# Patient Record
Sex: Female | Born: 1960
Health system: Southern US, Community
[De-identification: ages and names within clinical notes are randomized; demographics above are authoritative.]

## PROBLEM LIST (undated history)

## (undated) DIAGNOSIS — E785 Hyperlipidemia, unspecified: Secondary | ICD-10-CM

## (undated) HISTORY — DX: Hyperlipidemia, unspecified: E78.5

## (undated) HISTORY — PX: GUM SURGERY: SHX658

## (undated) HISTORY — PX: BREAST SURGERY: SHX581

---

## 1998-04-09 ENCOUNTER — Ambulatory Visit (HOSPITAL_COMMUNITY): Admission: RE | Admit: 1998-04-09 | Discharge: 1998-04-09 | Payer: Self-pay | Admitting: Obstetrics & Gynecology

## 2001-09-21 ENCOUNTER — Other Ambulatory Visit: Admission: RE | Admit: 2001-09-21 | Discharge: 2001-09-21 | Payer: Self-pay | Admitting: Obstetrics & Gynecology

## 2003-09-15 ENCOUNTER — Other Ambulatory Visit: Admission: RE | Admit: 2003-09-15 | Discharge: 2003-09-15 | Payer: Self-pay | Admitting: Obstetrics & Gynecology

## 2003-10-25 ENCOUNTER — Ambulatory Visit (HOSPITAL_COMMUNITY): Admission: RE | Admit: 2003-10-25 | Discharge: 2003-10-25 | Payer: Self-pay | Admitting: Obstetrics & Gynecology

## 2004-10-16 ENCOUNTER — Other Ambulatory Visit: Admission: RE | Admit: 2004-10-16 | Discharge: 2004-10-16 | Payer: Self-pay | Admitting: Obstetrics & Gynecology

## 2004-11-11 ENCOUNTER — Ambulatory Visit (HOSPITAL_COMMUNITY): Admission: RE | Admit: 2004-11-11 | Discharge: 2004-11-11 | Payer: Self-pay | Admitting: Obstetrics & Gynecology

## 2005-12-29 ENCOUNTER — Ambulatory Visit (HOSPITAL_COMMUNITY): Admission: RE | Admit: 2005-12-29 | Discharge: 2005-12-29 | Payer: Self-pay | Admitting: Obstetrics & Gynecology

## 2007-01-14 ENCOUNTER — Ambulatory Visit (HOSPITAL_COMMUNITY): Admission: RE | Admit: 2007-01-14 | Discharge: 2007-01-14 | Payer: Self-pay | Admitting: Obstetrics & Gynecology

## 2008-07-06 ENCOUNTER — Ambulatory Visit (HOSPITAL_COMMUNITY): Admission: RE | Admit: 2008-07-06 | Discharge: 2008-07-06 | Payer: Self-pay | Admitting: Obstetrics & Gynecology

## 2009-04-02 ENCOUNTER — Emergency Department (HOSPITAL_COMMUNITY): Admission: EM | Admit: 2009-04-02 | Discharge: 2009-04-02 | Payer: Self-pay | Admitting: Family Medicine

## 2009-06-15 ENCOUNTER — Ambulatory Visit: Payer: Self-pay | Admitting: Internal Medicine

## 2009-08-14 ENCOUNTER — Ambulatory Visit: Payer: Self-pay | Admitting: Internal Medicine

## 2009-08-16 ENCOUNTER — Ambulatory Visit: Payer: Self-pay | Admitting: Internal Medicine

## 2009-10-26 ENCOUNTER — Ambulatory Visit (HOSPITAL_COMMUNITY): Admission: RE | Admit: 2009-10-26 | Discharge: 2009-10-26 | Payer: Self-pay | Admitting: Obstetrics & Gynecology

## 2009-12-21 ENCOUNTER — Ambulatory Visit: Payer: Self-pay | Admitting: Internal Medicine

## 2010-08-14 ENCOUNTER — Ambulatory Visit: Payer: Self-pay | Admitting: Internal Medicine

## 2011-01-29 ENCOUNTER — Other Ambulatory Visit (HOSPITAL_COMMUNITY): Payer: Self-pay | Admitting: Obstetrics & Gynecology

## 2011-01-29 DIAGNOSIS — Z1231 Encounter for screening mammogram for malignant neoplasm of breast: Secondary | ICD-10-CM

## 2011-02-06 ENCOUNTER — Ambulatory Visit (HOSPITAL_COMMUNITY)
Admission: RE | Admit: 2011-02-06 | Discharge: 2011-02-06 | Disposition: A | Discharge status: Home / Self Care | Payer: 59 | Source: Ambulatory Visit | Attending: Obstetrics & Gynecology | Admitting: Obstetrics & Gynecology

## 2011-02-06 DIAGNOSIS — Z1231 Encounter for screening mammogram for malignant neoplasm of breast: Secondary | ICD-10-CM | POA: Insufficient documentation

## 2011-02-13 ENCOUNTER — Ambulatory Visit (INDEPENDENT_AMBULATORY_CARE_PROVIDER_SITE_OTHER): Payer: 59 | Admitting: Internal Medicine

## 2011-02-13 DIAGNOSIS — E039 Hypothyroidism, unspecified: Secondary | ICD-10-CM

## 2011-02-13 DIAGNOSIS — E785 Hyperlipidemia, unspecified: Secondary | ICD-10-CM

## 2011-08-18 ENCOUNTER — Encounter: Payer: Self-pay | Admitting: Internal Medicine

## 2011-08-19 ENCOUNTER — Encounter: Payer: Self-pay | Admitting: Internal Medicine

## 2011-08-19 ENCOUNTER — Ambulatory Visit (INDEPENDENT_AMBULATORY_CARE_PROVIDER_SITE_OTHER): Payer: 59 | Admitting: Internal Medicine

## 2011-08-19 VITALS — BP 116/74 | HR 68 | Temp 98.3°F | Ht 60.25 in | Wt 111.5 lb

## 2011-08-19 DIAGNOSIS — E039 Hypothyroidism, unspecified: Secondary | ICD-10-CM

## 2011-08-19 DIAGNOSIS — N6019 Diffuse cystic mastopathy of unspecified breast: Secondary | ICD-10-CM

## 2011-08-19 DIAGNOSIS — Z0000 Encounter for general adult medical examination without abnormal findings: Secondary | ICD-10-CM

## 2011-08-19 DIAGNOSIS — E785 Hyperlipidemia, unspecified: Secondary | ICD-10-CM

## 2011-08-19 DIAGNOSIS — R002 Palpitations: Secondary | ICD-10-CM

## 2011-08-19 DIAGNOSIS — H409 Unspecified glaucoma: Secondary | ICD-10-CM

## 2011-08-19 DIAGNOSIS — R011 Cardiac murmur, unspecified: Secondary | ICD-10-CM

## 2011-08-19 DIAGNOSIS — R3129 Other microscopic hematuria: Secondary | ICD-10-CM

## 2011-08-19 DIAGNOSIS — R311 Benign essential microscopic hematuria: Secondary | ICD-10-CM

## 2011-08-19 LAB — POCT URINALYSIS DIPSTICK
Bilirubin, UA: NEGATIVE
Glucose, UA: NEGATIVE
Ketones, UA: NEGATIVE
Leukocytes, UA: NEGATIVE
Nitrite, UA: NEGATIVE
Protein, UA: NEGATIVE
Spec Grav, UA: 1.01
Urobilinogen, UA: NEGATIVE
pH, UA: 5

## 2011-08-20 DIAGNOSIS — N6019 Diffuse cystic mastopathy of unspecified breast: Secondary | ICD-10-CM | POA: Insufficient documentation

## 2011-08-20 DIAGNOSIS — E785 Hyperlipidemia, unspecified: Secondary | ICD-10-CM | POA: Insufficient documentation

## 2011-08-20 DIAGNOSIS — H409 Unspecified glaucoma: Secondary | ICD-10-CM | POA: Insufficient documentation

## 2011-08-20 DIAGNOSIS — R311 Benign essential microscopic hematuria: Secondary | ICD-10-CM | POA: Insufficient documentation

## 2011-08-20 DIAGNOSIS — E039 Hypothyroidism, unspecified: Secondary | ICD-10-CM | POA: Insufficient documentation

## 2011-08-20 NOTE — Progress Notes (Signed)
  Subjective:    Patient ID: Jacqueline Graham, female    DOB: [birth year], 50 y.o.   MRN: [id number]  HPI 50 year old white female with history of hyperlipidemia, microscopic hematuria, hypothyroidism, migraine headaches, glaucoma both eyes for health maintenance exam. History of left fibroadenoma of breast in mid-1980s. Had [facility] spotted fever 1971. Last mammogram March 2012. Had tetanus immunization 2008. GYN physician is Dr. Aldona Bar  Recently had an episode of palpitations in bed. Wasn't aware of any irregular heartbeat just some pounding that was self-limited. Never had this before that she was aware of. Doesn't exercise although that much. 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 Is intolerant of sulfa and amoxicillin.  Recent lab work reviewed with her shows TSH to be normal at 1.650. Lipid panel is normal on statin therapy.    Review of Systems  Constitutional: Negative.   HENT: Negative.   Eyes: Negative.   Respiratory: Negative.   Cardiovascular: Positive for palpitations.  Genitourinary: Negative.   Musculoskeletal: Negative.   Neurological: Negative.   Hematological: Negative.   Psychiatric/Behavioral: Negative.        Objective:   Physical Exam  Vitals reviewed. Constitutional: She is oriented to person, place, and time. No distress.  HENT:  Head: Normocephalic and atraumatic.  Right Ear: External ear normal.  Left Ear: External ear normal.  Eyes: Conjunctivae and EOM are normal. Pupils are equal, round, and reactive to light. Right eye exhibits no discharge. Left eye exhibits no discharge. No scleral icterus.  Neck: Neck supple. No thyromegaly present.  Cardiovascular: Normal rate and regular rhythm.   Murmur heard.      2/6 short systolic ejection murmur  Pulmonary/Chest: Effort normal and breath sounds normal. She has no wheezes. She has no rales.  Abdominal: Soft. Bowel sounds are normal. She exhibits no mass. There is no tenderness. There is no  rebound and no guarding.  Genitourinary:       Deferred to GYN  Musculoskeletal: She exhibits no edema.  Neurological: She is alert and oriented to person, place, and time. She has normal reflexes. No cranial nerve deficit.  Skin: Skin is warm and dry.  Psychiatric: She has a normal mood and affect.          Assessment & Plan:  Systolic ejection murmur associated with recent episode of palpitations EKG shows borderline LVH. Blood pressure is normal. Patient will have 2-D echocardiogram for further evaluation.  Hyperlipidemia  Hypothyroidism  Glaucoma  Migraine headaches  Microscopic hematuria previously evaluated  History of fibrocystic breast disease  Have arranged for 2-D echocardiogram and will followup appropriately after that. Otherwise see her in 6 months for fasting lipid panel, liver functions TSH and office visit

## 2011-08-21 ENCOUNTER — Encounter: Payer: Self-pay | Admitting: Internal Medicine

## 2011-08-26 ENCOUNTER — Ambulatory Visit (HOSPITAL_COMMUNITY)
Admission: RE | Admit: 2011-08-26 | Discharge: 2011-08-26 | Disposition: A | Discharge status: Home / Self Care | Payer: 59 | Source: Ambulatory Visit | Attending: Internal Medicine | Admitting: Internal Medicine

## 2011-08-26 DIAGNOSIS — I059 Rheumatic mitral valve disease, unspecified: Secondary | ICD-10-CM | POA: Insufficient documentation

## 2011-08-26 DIAGNOSIS — R011 Cardiac murmur, unspecified: Secondary | ICD-10-CM | POA: Insufficient documentation

## 2011-08-26 DIAGNOSIS — I379 Nonrheumatic pulmonary valve disorder, unspecified: Secondary | ICD-10-CM | POA: Insufficient documentation

## 2011-08-26 DIAGNOSIS — E785 Hyperlipidemia, unspecified: Secondary | ICD-10-CM | POA: Insufficient documentation

## 2011-09-04 ENCOUNTER — Telehealth: Payer: Self-pay | Admitting: *Deleted

## 2011-09-04 NOTE — Telephone Encounter (Signed)
Pt called wanting you to call her regarding her Echo results.

## 2011-09-05 NOTE — Telephone Encounter (Signed)
Please ask Stacie to find - do not have in in-box

## 2011-09-05 NOTE — Telephone Encounter (Signed)
Left message for patient to call office.  Echo results WNL.

## 2011-10-07 ENCOUNTER — Other Ambulatory Visit: Payer: Self-pay | Admitting: Internal Medicine

## 2011-12-18 ENCOUNTER — Other Ambulatory Visit: Payer: Self-pay | Admitting: Internal Medicine

## 2012-02-17 ENCOUNTER — Ambulatory Visit (INDEPENDENT_AMBULATORY_CARE_PROVIDER_SITE_OTHER): Payer: 59 | Admitting: Internal Medicine

## 2012-02-17 ENCOUNTER — Encounter: Payer: Self-pay | Admitting: Internal Medicine

## 2012-02-17 VITALS — BP 104/68 | Temp 98.2°F | Wt 110.0 lb

## 2012-02-17 DIAGNOSIS — R1011 Right upper quadrant pain: Secondary | ICD-10-CM

## 2012-02-17 DIAGNOSIS — E0590 Thyrotoxicosis, unspecified without thyrotoxic crisis or storm: Secondary | ICD-10-CM

## 2012-02-17 DIAGNOSIS — E785 Hyperlipidemia, unspecified: Secondary | ICD-10-CM

## 2012-02-17 DIAGNOSIS — R1013 Epigastric pain: Secondary | ICD-10-CM

## 2012-02-17 NOTE — Patient Instructions (Signed)
We will schedule ultrasound of the gallbladder in the near future. Continue same medications and return in 6 months for physical exam.

## 2012-02-17 NOTE — Progress Notes (Signed)
  Subjective:    Patient ID: Jacqueline Graham, female    DOB: Aug 10, 1961, 51 y.o.   MRN: [id number]  HPI   51 year-old white female with history of hyperlipidemia and hypothyroidism in today for six-month recheck. Recent lab work done at [facility] is within normal limits. No changes in medication dosages. Patient's been having some pain in her right upper quadrant on occasion. She also had some epigastric pain when she was moving her parents to [place] from their home. Has not had persistent epigastric pain since that time. She took some TUMS. She wants to have her gallbladder checked. She took oral contraceptives for a number of years.    Review of Systems     Objective:   Physical Exam chest clear to auscultation; cardiac exam regular rate and rhythm; abdomen: nondistended, no hepatosplenomegaly masses or significant tenderness.           Assessment & Plan:  Hyperlipidemia-stable  Hypothyroidism-stable  Right upper quadrant abdominal pain rule out cholelithiasis  Recent epigastric pain doubt cardiac pain. It started at rest when she was driving out to help her parents move  Plan: Patient will have ultrasound of the gallbladder in the near future. Otherwise return in 6 months for physical exam.

## 2012-02-18 NOTE — Progress Notes (Signed)
Addended by: Judy Pimple on: 02/18/2012 01:15 PM   Modules accepted: Orders

## 2012-02-20 ENCOUNTER — Ambulatory Visit (HOSPITAL_COMMUNITY)
Admission: RE | Admit: 2012-02-20 | Discharge: 2012-02-20 | Disposition: A | Discharge status: Home / Self Care | Payer: 59 | Source: Ambulatory Visit | Attending: Internal Medicine | Admitting: Internal Medicine

## 2012-02-20 DIAGNOSIS — Q619 Cystic kidney disease, unspecified: Secondary | ICD-10-CM | POA: Insufficient documentation

## 2012-02-20 DIAGNOSIS — R1011 Right upper quadrant pain: Secondary | ICD-10-CM | POA: Insufficient documentation

## 2012-02-27 ENCOUNTER — Other Ambulatory Visit (HOSPITAL_COMMUNITY): Payer: Self-pay | Admitting: Obstetrics & Gynecology

## 2012-02-27 ENCOUNTER — Other Ambulatory Visit (HOSPITAL_COMMUNITY): Payer: 59

## 2012-02-27 DIAGNOSIS — Z1231 Encounter for screening mammogram for malignant neoplasm of breast: Secondary | ICD-10-CM

## 2012-03-24 ENCOUNTER — Ambulatory Visit (HOSPITAL_COMMUNITY)
Admission: RE | Admit: 2012-03-24 | Discharge: 2012-03-24 | Disposition: A | Discharge status: Home / Self Care | Payer: 59 | Source: Ambulatory Visit | Attending: Obstetrics & Gynecology | Admitting: Obstetrics & Gynecology

## 2012-03-24 DIAGNOSIS — Z1231 Encounter for screening mammogram for malignant neoplasm of breast: Secondary | ICD-10-CM | POA: Insufficient documentation

## 2012-04-20 ENCOUNTER — Other Ambulatory Visit: Payer: Self-pay | Admitting: Internal Medicine

## 2012-07-23 ENCOUNTER — Other Ambulatory Visit: Payer: Self-pay

## 2012-07-23 MED ORDER — LEVOTHYROXINE SODIUM 50 MCG PO TABS: 50.0000 ug | ORAL_TABLET | Freq: Every day | ORAL | Status: DC

## 2012-08-20 ENCOUNTER — Other Ambulatory Visit: Payer: 59 | Admitting: Internal Medicine

## 2012-08-20 LAB — CBC WITH DIFFERENTIAL/PLATELET
Basophils Absolute: 0 10*3/uL (ref 0.0–0.1)
Basophils Relative: 1 % (ref 0–1)
Eosinophils Absolute: 0.2 10*3/uL (ref 0.0–0.7)
Eosinophils Relative: 5 % (ref 0–5)
HCT: 36.3 % (ref 36.0–46.0)
Hemoglobin: 12.7 g/dL (ref 12.0–15.0)
Lymphocytes Relative: 48 % — ABNORMAL HIGH (ref 12–46)
Lymphs Abs: 1.9 10*3/uL (ref 0.7–4.0)
MCH: 31 pg (ref 26.0–34.0)
MCHC: 35 g/dL (ref 30.0–36.0)
MCV: 88.5 fL (ref 78.0–100.0)
Monocytes Absolute: 0.3 10*3/uL (ref 0.1–1.0)
Monocytes Relative: 9 % (ref 3–12)
Neutro Abs: 1.5 10*3/uL — ABNORMAL LOW (ref 1.7–7.7)
Neutrophils Relative %: 37 % — ABNORMAL LOW (ref 43–77)
Platelets: 203 10*3/uL (ref 150–400)
RBC: 4.1 MIL/uL (ref 3.87–5.11)
RDW: 13 % (ref 11.5–15.5)
WBC: 3.9 10*3/uL — ABNORMAL LOW (ref 4.0–10.5)

## 2012-08-20 LAB — TSH: TSH: 1.494 u[IU]/mL (ref 0.350–4.500)

## 2012-08-20 LAB — COMPREHENSIVE METABOLIC PANEL
ALT: 18 U/L (ref 0–35)
AST: 24 U/L (ref 0–37)
Albumin: 4.5 g/dL (ref 3.5–5.2)
Alkaline Phosphatase: 54 U/L (ref 39–117)
BUN: 16 mg/dL (ref 6–23)
CO2: 28 mEq/L (ref 19–32)
Calcium: 9.5 mg/dL (ref 8.4–10.5)
Chloride: 105 mEq/L (ref 96–112)
Creat: 0.96 mg/dL (ref 0.50–1.10)
Glucose, Bld: 75 mg/dL (ref 70–99)
Potassium: 4.1 mEq/L (ref 3.5–5.3)
Sodium: 141 mEq/L (ref 135–145)
Total Bilirubin: 0.5 mg/dL (ref 0.3–1.2)
Total Protein: 6.6 g/dL (ref 6.0–8.3)

## 2012-08-20 LAB — LIPID PANEL
Cholesterol: 150 mg/dL (ref 0–200)
HDL: 66 mg/dL (ref >39)
LDL Cholesterol: 75 mg/dL (ref 0–99)
Total CHOL/HDL Ratio: 2.3 Ratio
Triglycerides: 43 mg/dL (ref <150)
VLDL: 9 mg/dL (ref 0–40)

## 2012-08-21 LAB — VITAMIN D 25 HYDROXY (VIT D DEFICIENCY, FRACTURES): Vit D, 25-Hydroxy: 52 ng/mL (ref 30–89)

## 2012-08-23 ENCOUNTER — Encounter: Payer: 59 | Admitting: Internal Medicine

## 2012-11-02 ENCOUNTER — Encounter: Payer: Self-pay | Admitting: Internal Medicine

## 2012-11-02 ENCOUNTER — Ambulatory Visit (INDEPENDENT_AMBULATORY_CARE_PROVIDER_SITE_OTHER): Payer: 59 | Admitting: Internal Medicine

## 2012-11-02 VITALS — BP 102/58 | HR 76 | Ht 60.75 in | Wt 111.0 lb

## 2012-11-02 DIAGNOSIS — Z0000 Encounter for general adult medical examination without abnormal findings: Secondary | ICD-10-CM

## 2012-11-02 DIAGNOSIS — E039 Hypothyroidism, unspecified: Secondary | ICD-10-CM

## 2012-11-02 DIAGNOSIS — R002 Palpitations: Secondary | ICD-10-CM

## 2012-11-02 DIAGNOSIS — H409 Unspecified glaucoma: Secondary | ICD-10-CM

## 2012-11-02 DIAGNOSIS — Z8669 Personal history of other diseases of the nervous system and sense organs: Secondary | ICD-10-CM

## 2012-11-02 DIAGNOSIS — E785 Hyperlipidemia, unspecified: Secondary | ICD-10-CM

## 2012-11-02 LAB — POCT URINALYSIS DIPSTICK
Bilirubin, UA: NEGATIVE
Glucose, UA: NEGATIVE
Ketones, UA: NEGATIVE
Leukocytes, UA: NEGATIVE
Nitrite, UA: NEGATIVE
Protein, UA: NEGATIVE
Spec Grav, UA: 1.02
Urobilinogen, UA: NEGATIVE
pH, UA: 6

## 2012-11-02 NOTE — Patient Instructions (Addendum)
Continue same meds and return in 6 months 

## 2012-11-19 ENCOUNTER — Other Ambulatory Visit: Payer: Self-pay

## 2012-11-19 MED ORDER — LEVOTHYROXINE SODIUM 50 MCG PO TABS: 50.0000 ug | ORAL_TABLET | Freq: Every day | ORAL | Status: DC

## 2012-12-29 ENCOUNTER — Other Ambulatory Visit: Payer: Self-pay | Admitting: Internal Medicine

## 2013-02-21 ENCOUNTER — Encounter: Payer: Self-pay | Admitting: Internal Medicine

## 2013-02-21 DIAGNOSIS — R002 Palpitations: Secondary | ICD-10-CM | POA: Insufficient documentation

## 2013-02-21 NOTE — Progress Notes (Signed)
Subjective:    Patient ID: Jacqueline Graham, female    DOB: 1961-09-09, 52 y.o.   MRN: [id number]  HPI 52 year old white female with history of hyperlipidemia, microscopic hematuria, hypothyroidism, migraine headaches, glaucoma both eyes for health maintenance exam and evaluation of medical problems.  History of left breast fibroadenoma mid 1980s. [place] spotted fever 1971. Tetanus immunization 2008. GYN physician is Dr. Aldona Bar.  History of palpitations in 2012. A cardiac murmur was discovered at that time. She had 2-D echocardiogram October 2012 showing normal left ventricle and wall thickness. Systolic function was normal. Mildly thickened mitral valve leaflets. Pulmonic tricuspid valves normal. Pulmonary artery poorly visualized. Right atrium normal size. Aortic valve normal. He was thought to be essentially normal except for mildly thickened mitral valve leaflets.  Uses Xalatan for glaucoma, takes calcium supplement, is on thyroid replacement therapy and Zocor.  Nonsmoker. Social alcohol consumption.  Is allergic to amoxicillin and sulfa.  Works as an Dentist at [employer]. The should be admitted and we haven't on the same we don't have one if you don't see a definite but is less 100 and Social history: She is married has a two-year degree. No children. And in and in right peripheral to beta blockers pelvic but he  Family history: Mother with history of diabetes mellitus, hypertension, coronary artery disease, stent placement, hyperlipidemia and hypothyroidism. Father with history of third degree AV block, stroke and prostate issues.  Return grandmother with history of colon cancer. Maternal grandmother with history of lymphoma. 3 brothers and one sister. Brother with history of diabetes hypertension and kidney stones.    Review of Systems  Constitutional: Negative.   HENT: Negative.   Eyes:       History of glaucoma  Cardiovascular:        History of palpitations and  cardiac murmur  Gastrointestinal: Negative.   Endocrine: Negative.   Genitourinary: Negative.   Neurological: Negative.   Hematological: Negative.   Psychiatric/Behavioral: Negative.        Objective:   Physical Exam  Vitals reviewed. Constitutional: She is oriented to person, place, and time. She appears well-developed and well-nourished. No distress.  HENT:  Head: Normocephalic and atraumatic.  Right Ear: External ear normal.  Left Ear: External ear normal.  Mouth/Throat: Oropharynx is clear and moist. No oropharyngeal exudate.  Eyes: Conjunctivae and EOM are normal. Pupils are equal, round, and reactive to light. Right eye exhibits no discharge. Left eye exhibits no discharge. No scleral icterus.  Neck: Neck supple. No JVD present. No tracheal deviation present. No thyromegaly present.  Cardiovascular: Normal rate and regular rhythm.   Murmur heard. 2/6 systolic ejection murmur  Pulmonary/Chest: Effort normal and breath sounds normal. No respiratory distress. She has no wheezes. She has no rales. She exhibits no tenderness.  Breasts normal female  Genitourinary:  Deferred to GYN  Musculoskeletal: Normal range of motion.  Lymphadenopathy:    She has no cervical adenopathy.  Neurological: She is alert and oriented to person, place, and time. She has normal reflexes. She displays normal reflexes. No cranial nerve deficit.  Skin: Skin is warm and dry. No rash noted. She is not diaphoretic.  Psychiatric: She has a normal mood and affect. Her behavior is normal. Judgment and thought content normal.          Assessment & Plan:  Hyperlipidemia-stable on statin therapy  Hypothyroidism-stable on thyroid replacement therapy  History of palpitations and cardiac murmur previously evaluated by 2-D echocardiogram-stable   Glaucoma-stable on  Xalatan treated in both eyes    History of migraine headaches-stable and controlled  History of microhematuria previously evaluated by  urologist  History of fibrocystic breast disease  Plan: Patient is to return in 6 months for office visit blood pressure check lipid panel liver functions and TSH. No change in medication regimen.

## 2013-05-23 ENCOUNTER — Other Ambulatory Visit: Payer: 59 | Admitting: Internal Medicine

## 2013-05-23 DIAGNOSIS — Z79899 Other long term (current) drug therapy: Secondary | ICD-10-CM

## 2013-05-23 DIAGNOSIS — E039 Hypothyroidism, unspecified: Secondary | ICD-10-CM

## 2013-05-23 DIAGNOSIS — E785 Hyperlipidemia, unspecified: Secondary | ICD-10-CM

## 2013-05-23 LAB — TSH: TSH: 2.274 u[IU]/mL (ref 0.350–4.500)

## 2013-05-23 LAB — HEPATIC FUNCTION PANEL
ALT: 15 U/L (ref 0–35)
AST: 23 U/L (ref 0–37)
Albumin: 4.2 g/dL (ref 3.5–5.2)
Alkaline Phosphatase: 53 U/L (ref 39–117)
Bilirubin, Direct: 0.1 mg/dL (ref 0.0–0.3)
Indirect Bilirubin: 0.3 mg/dL (ref 0.0–0.9)
Total Bilirubin: 0.4 mg/dL (ref 0.3–1.2)
Total Protein: 6.7 g/dL (ref 6.0–8.3)

## 2013-05-23 LAB — LIPID PANEL
Cholesterol: 155 mg/dL (ref 0–200)
HDL: 69 mg/dL (ref >39)
LDL Cholesterol: 76 mg/dL (ref 0–99)
Total CHOL/HDL Ratio: 2.2 Ratio
Triglycerides: 49 mg/dL (ref <150)
VLDL: 10 mg/dL (ref 0–40)

## 2013-05-24 ENCOUNTER — Encounter: Payer: Self-pay | Admitting: Internal Medicine

## 2013-05-24 ENCOUNTER — Ambulatory Visit (INDEPENDENT_AMBULATORY_CARE_PROVIDER_SITE_OTHER): Payer: 59 | Admitting: Internal Medicine

## 2013-05-24 VITALS — BP 96/60 | HR 68 | Temp 98.3°F | Wt 111.0 lb

## 2013-05-24 DIAGNOSIS — E785 Hyperlipidemia, unspecified: Secondary | ICD-10-CM

## 2013-05-24 DIAGNOSIS — E039 Hypothyroidism, unspecified: Secondary | ICD-10-CM

## 2013-05-25 ENCOUNTER — Other Ambulatory Visit: Payer: Self-pay | Admitting: Internal Medicine

## 2013-06-03 ENCOUNTER — Other Ambulatory Visit (HOSPITAL_COMMUNITY): Payer: Self-pay | Admitting: Obstetrics & Gynecology

## 2013-06-03 DIAGNOSIS — Z1231 Encounter for screening mammogram for malignant neoplasm of breast: Secondary | ICD-10-CM

## 2013-06-08 ENCOUNTER — Ambulatory Visit (HOSPITAL_COMMUNITY): Payer: 59

## 2013-06-15 ENCOUNTER — Ambulatory Visit (HOSPITAL_COMMUNITY): Payer: 59

## 2013-06-23 ENCOUNTER — Ambulatory Visit (HOSPITAL_COMMUNITY)
Admission: RE | Admit: 2013-06-23 | Discharge: 2013-06-23 | Disposition: A | Discharge status: Home / Self Care | Payer: 59 | Source: Ambulatory Visit | Attending: Obstetrics & Gynecology | Admitting: Obstetrics & Gynecology

## 2013-06-23 DIAGNOSIS — Z1231 Encounter for screening mammogram for malignant neoplasm of breast: Secondary | ICD-10-CM | POA: Insufficient documentation

## 2013-08-26 ENCOUNTER — Encounter: Payer: Self-pay | Admitting: Internal Medicine

## 2013-08-26 NOTE — Patient Instructions (Addendum)
Continue same meds and return in 6 months 

## 2013-08-26 NOTE — Progress Notes (Signed)
  Subjective:    Patient ID: Jacqueline Graham, female    DOB: [birth year], 52 y.o.   MRN: [id number]  HPI In today for six-month recheck on hyperlipidemia and hypothyroidism. No new issues. Lipid panel and liver functions are entirely within normal limits. TSH is normal on thyroid replacement therapy. She feels well.    Review of Systems     Objective:   Physical Exam neck is supple without JVD thyromegaly or carotid bruits. Chest clear to auscultation. Cardiac exam regular rate and rhythm normal S1 and S2. Extremities without edema        Assessment & Plan:  Hyperlipidemia-stable on Zocor  Hypothyroidism-stable on thyroid replacement therapy  Plan: Continue same medications and return for physical exam in 6 months

## 2013-11-28 ENCOUNTER — Other Ambulatory Visit: Payer: 59 | Admitting: Internal Medicine

## 2013-11-28 DIAGNOSIS — Z130 Encounter for screening for diseases of the blood and blood-forming organs and certain disorders involving the immune mechanism: Secondary | ICD-10-CM

## 2013-11-28 DIAGNOSIS — Z0000 Encounter for general adult medical examination without abnormal findings: Secondary | ICD-10-CM

## 2013-11-28 DIAGNOSIS — Z1329 Encounter for screening for other suspected endocrine disorder: Secondary | ICD-10-CM

## 2013-11-28 DIAGNOSIS — E785 Hyperlipidemia, unspecified: Secondary | ICD-10-CM

## 2013-11-28 DIAGNOSIS — E039 Hypothyroidism, unspecified: Secondary | ICD-10-CM

## 2013-11-28 DIAGNOSIS — Z13228 Encounter for screening for other metabolic disorders: Secondary | ICD-10-CM

## 2013-11-28 LAB — COMPREHENSIVE METABOLIC PANEL
ALT: 19 U/L (ref 0–35)
AST: 23 U/L (ref 0–37)
Albumin: 4.2 g/dL (ref 3.5–5.2)
Alkaline Phosphatase: 54 U/L (ref 39–117)
BUN: 15 mg/dL (ref 6–23)
CO2: 29 mEq/L (ref 19–32)
Calcium: 9.5 mg/dL (ref 8.4–10.5)
Chloride: 102 mEq/L (ref 96–112)
Creat: 0.87 mg/dL (ref 0.50–1.10)
Glucose, Bld: 85 mg/dL (ref 70–99)
Potassium: 4 mEq/L (ref 3.5–5.3)
Sodium: 138 mEq/L (ref 135–145)
Total Bilirubin: 0.4 mg/dL (ref 0.3–1.2)
Total Protein: 6.7 g/dL (ref 6.0–8.3)

## 2013-11-28 LAB — CBC WITH DIFFERENTIAL/PLATELET
Basophils Absolute: 0 10*3/uL (ref 0.0–0.1)
Basophils Relative: 1 % (ref 0–1)
Eosinophils Absolute: 0.1 10*3/uL (ref 0.0–0.7)
Eosinophils Relative: 3 % (ref 0–5)
HCT: 37 % (ref 36.0–46.0)
Hemoglobin: 12.8 g/dL (ref 12.0–15.0)
Lymphocytes Relative: 39 % (ref 12–46)
Lymphs Abs: 1.8 10*3/uL (ref 0.7–4.0)
MCH: 30.8 pg (ref 26.0–34.0)
MCHC: 34.6 g/dL (ref 30.0–36.0)
MCV: 89.2 fL (ref 78.0–100.0)
Monocytes Absolute: 0.5 10*3/uL (ref 0.1–1.0)
Monocytes Relative: 10 % (ref 3–12)
Neutro Abs: 2.1 10*3/uL (ref 1.7–7.7)
Neutrophils Relative %: 47 % (ref 43–77)
Platelets: 221 10*3/uL (ref 150–400)
RBC: 4.15 MIL/uL (ref 3.87–5.11)
RDW: 14 % (ref 11.5–15.5)
WBC: 4.6 10*3/uL (ref 4.0–10.5)

## 2013-11-28 LAB — LIPID PANEL
CHOLESTEROL: 166 mg/dL (ref 0–200)
HDL: 69 mg/dL (ref >39)
LDL Cholesterol: 86 mg/dL (ref 0–99)
TRIGLYCERIDES: 54 mg/dL (ref <150)
Total CHOL/HDL Ratio: 2.4 Ratio
VLDL: 11 mg/dL (ref 0–40)

## 2013-11-28 LAB — TSH: TSH: 2.21 u[IU]/mL (ref 0.350–4.500)

## 2013-11-29 ENCOUNTER — Encounter: Payer: 59 | Admitting: Internal Medicine

## 2013-11-29 LAB — VITAMIN D 25 HYDROXY (VIT D DEFICIENCY, FRACTURES): Vit D, 25-Hydroxy: 45 ng/mL (ref 30–89)

## 2013-12-02 ENCOUNTER — Encounter: Payer: 59 | Admitting: Internal Medicine

## 2013-12-19 ENCOUNTER — Encounter: Payer: 59 | Admitting: Internal Medicine

## 2013-12-26 ENCOUNTER — Other Ambulatory Visit (HOSPITAL_COMMUNITY)
Admission: RE | Admit: 2013-12-26 | Discharge: 2013-12-26 | Disposition: A | Discharge status: Home / Self Care | Payer: 59 | Source: Ambulatory Visit | Attending: Internal Medicine | Admitting: Internal Medicine

## 2013-12-26 ENCOUNTER — Ambulatory Visit (INDEPENDENT_AMBULATORY_CARE_PROVIDER_SITE_OTHER): Payer: 59 | Admitting: Internal Medicine

## 2013-12-26 ENCOUNTER — Encounter: Payer: Self-pay | Admitting: Internal Medicine

## 2013-12-26 VITALS — BP 116/64 | HR 76 | Temp 98.4°F | Ht 61.0 in | Wt 111.0 lb

## 2013-12-26 DIAGNOSIS — E039 Hypothyroidism, unspecified: Secondary | ICD-10-CM

## 2013-12-26 DIAGNOSIS — I340 Nonrheumatic mitral (valve) insufficiency: Secondary | ICD-10-CM

## 2013-12-26 DIAGNOSIS — Z8679 Personal history of other diseases of the circulatory system: Secondary | ICD-10-CM

## 2013-12-26 DIAGNOSIS — E785 Hyperlipidemia, unspecified: Secondary | ICD-10-CM

## 2013-12-26 DIAGNOSIS — I050 Rheumatic mitral stenosis: Secondary | ICD-10-CM

## 2013-12-26 DIAGNOSIS — Z0000 Encounter for general adult medical examination without abnormal findings: Secondary | ICD-10-CM

## 2013-12-26 DIAGNOSIS — R3129 Other microscopic hematuria: Secondary | ICD-10-CM

## 2013-12-26 DIAGNOSIS — Z87898 Personal history of other specified conditions: Secondary | ICD-10-CM

## 2013-12-26 DIAGNOSIS — Z8669 Personal history of other diseases of the nervous system and sense organs: Secondary | ICD-10-CM

## 2013-12-26 DIAGNOSIS — Z01419 Encounter for gynecological examination (general) (routine) without abnormal findings: Secondary | ICD-10-CM | POA: Insufficient documentation

## 2013-12-26 DIAGNOSIS — H409 Unspecified glaucoma: Secondary | ICD-10-CM

## 2013-12-26 LAB — POCT URINALYSIS DIPSTICK
BILIRUBIN UA: NEGATIVE
Glucose, UA: NEGATIVE
KETONES UA: NEGATIVE
LEUKOCYTES UA: NEGATIVE
Nitrite, UA: NEGATIVE
PH UA: 5.5
Protein, UA: NEGATIVE
SPEC GRAV UA: 1.025
Urobilinogen, UA: NEGATIVE

## 2013-12-26 NOTE — Progress Notes (Signed)
   Subjective:    Patient ID: Jacqueline Graham, female    DOB: November 12, 1961, 53 y.o.   MRN: [id number]  HPI 53 year old Female for health maintenance and evaluation of medical issues. No new complaints. History of hyperlipidemia, microscopic hematuria, hypothyroidism, migraine headaches, glaucoma both eyes.  Past medical history: Left breast fibroadenoma removed in the mid 1980s. [place] spotted fever in 1971. Tetanus immunization 2008. Has GYN physician.  History of palpitations 2012. Cardiac murmur was discovered at the time. Had mildly thickened mitral valve leaflets on 2-D echocardiogram. Aortic valve was normal.  Patient uses Xalatan for glaucoma. Takes calcium supplement. Is on thyroid replacement for hypothyroidism and Zocor for hyperlipidemia.  Social history: Nonsmoker. Social alcohol consumption. 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 Has a 2 year college degree. No children.   Family History: Father had stroke and head injury about 5 years ago with hx of 3 degree AV block and syncope. Now has pacemaker. Mother with dementia and hx kidney stone s/p ARF after receiving IV contrast. Mother with hyperlipidemia, HTN, hypothyroidism, diabetes mellitus, coronary artery disease status post stent placement. Maternal grandmother with history of colon cancer. Maternal grandmother with history of lymphoma. 3 brothers and one sister. Brother with history of diabetes, hypertension and kidney stones. Review of Systems  Constitutional: Negative for activity change, appetite change, fatigue and unexpected weight change.  All other systems reviewed and are negative.      Objective:   Physical Exam  Vitals reviewed. Constitutional: She is oriented to person, place, and time. She appears well-developed and well-nourished. No distress.  HENT:  Head: Normocephalic and atraumatic.  Right Ear: External ear normal.  Left Ear: External ear normal.  Mouth/Throat: Oropharynx is clear  and moist. No oropharyngeal exudate.  Eyes: Conjunctivae and EOM are normal. Pupils are equal, round, and reactive to light. Right eye exhibits no discharge. Left eye exhibits no discharge. No scleral icterus.  Neck: Neck supple. No JVD present. No thyromegaly present.  Cardiovascular: Normal rate and regular rhythm.   Murmur heard. 2/6 systolic ejection murmur  Pulmonary/Chest: Effort normal and breath sounds normal. No respiratory distress. She has no wheezes. She has no rales. She exhibits no tenderness.  Breasts normal female  Abdominal: Soft. Bowel sounds are normal. She exhibits no distension and no mass. There is no tenderness. There is no rebound and no guarding.  Genitourinary:  Deferred to GYN  Musculoskeletal: She exhibits no edema.  Lymphadenopathy:    She has no cervical adenopathy.  Neurological: She is alert and oriented to person, place, and time. She has normal reflexes. She displays normal reflexes. No cranial nerve deficit.  Skin: Skin is warm and dry. No rash noted. She is not diaphoretic.  Psychiatric: She has a normal mood and affect. Her behavior is normal. Judgment and thought content normal.          Assessment & Plan:  Hypothyroidism-stable on thyroid replacement  Hyperlipidemia  History of glaucoma  History of migraine headaches  History of palpitations  Plan: Continue same medications and return in 6 months for followup on hypothyroidism and hyperlipidemia.

## 2013-12-26 NOTE — Patient Instructions (Signed)
Continue same meds and return in 6 months 

## 2014-01-16 ENCOUNTER — Other Ambulatory Visit: Payer: Self-pay | Admitting: Internal Medicine

## 2014-06-05 ENCOUNTER — Other Ambulatory Visit: Payer: Self-pay | Admitting: Internal Medicine

## 2014-06-27 ENCOUNTER — Other Ambulatory Visit: Payer: 59 | Admitting: Internal Medicine

## 2014-06-27 DIAGNOSIS — Z79899 Other long term (current) drug therapy: Secondary | ICD-10-CM

## 2014-06-27 DIAGNOSIS — E785 Hyperlipidemia, unspecified: Secondary | ICD-10-CM

## 2014-06-27 LAB — HEPATIC FUNCTION PANEL
ALT: 15 U/L (ref 0–35)
AST: 21 U/L (ref 0–37)
Albumin: 4.4 g/dL (ref 3.5–5.2)
Alkaline Phosphatase: 54 U/L (ref 39–117)
BILIRUBIN DIRECT: 0.1 mg/dL (ref 0.0–0.3)
BILIRUBIN TOTAL: 0.6 mg/dL (ref 0.2–1.2)
Indirect Bilirubin: 0.5 mg/dL (ref 0.2–1.2)
Total Protein: 6.5 g/dL (ref 6.0–8.3)

## 2014-06-27 LAB — LIPID PANEL
CHOL/HDL RATIO: 2.2 ratio
CHOLESTEROL: 153 mg/dL (ref 0–200)
HDL: 69 mg/dL (ref >39)
LDL Cholesterol: 75 mg/dL (ref 0–99)
Triglycerides: 46 mg/dL (ref <150)
VLDL: 9 mg/dL (ref 0–40)

## 2014-06-29 ENCOUNTER — Encounter: Payer: Self-pay | Admitting: Internal Medicine

## 2014-06-29 ENCOUNTER — Ambulatory Visit (INDEPENDENT_AMBULATORY_CARE_PROVIDER_SITE_OTHER): Payer: 59 | Admitting: Internal Medicine

## 2014-06-29 VITALS — BP 96/58 | HR 80 | Wt 111.0 lb

## 2014-06-29 DIAGNOSIS — E039 Hypothyroidism, unspecified: Secondary | ICD-10-CM

## 2014-06-29 DIAGNOSIS — E785 Hyperlipidemia, unspecified: Secondary | ICD-10-CM

## 2014-06-29 DIAGNOSIS — Z8669 Personal history of other diseases of the nervous system and sense organs: Secondary | ICD-10-CM

## 2014-06-29 NOTE — Patient Instructions (Signed)
Continue same medications and return in 6 months 

## 2014-06-29 NOTE — Progress Notes (Signed)
   Subjective:    Patient ID: Jacqueline Graham, female    DOB: 02-07-1961, 53 y.o.   MRN: [id number]  HPI  53 year old White Female in today for six-month recheck on hypothyroidism and hyperlipidemia. She is on simvastatin 20 mg daily and thyroid replacement therapy. She has a history of glaucoma and uses Xalatan eyedrops. Feels well. Blood pressure is excellent. No new complaints.    Review of Systems     Objective:   Physical Exam Neck is supple without thyromegaly. Chest clear. Cardiac exam regular rate and rhythm. Extremities without edema       Assessment & Plan:  Hypothyroidism-TSH pending  Hyperlipidemia-lipid panel liver functions normal on statin therapy  History of glaucoma  Plan: TSH is pending. Continue same medications and return for physical exam in 6 months. Will receive annual flu vaccine through employment.

## 2014-08-01 ENCOUNTER — Other Ambulatory Visit: Payer: Self-pay | Admitting: Internal Medicine

## 2014-08-01 DIAGNOSIS — Z1231 Encounter for screening mammogram for malignant neoplasm of breast: Secondary | ICD-10-CM

## 2014-08-08 ENCOUNTER — Telehealth: Payer: Self-pay

## 2014-08-08 DIAGNOSIS — Z1211 Encounter for screening for malignant neoplasm of colon: Secondary | ICD-10-CM

## 2014-08-08 NOTE — Telephone Encounter (Signed)
Patient called to let us know that she would like to see Dr Collene Mares for her colonoscopy.  Appointment 08/24/2014 at 230.   Patient aware.

## 2014-08-10 ENCOUNTER — Ambulatory Visit (HOSPITAL_COMMUNITY)
Admission: RE | Admit: 2014-08-10 | Discharge: 2014-08-10 | Disposition: A | Discharge status: Home / Self Care | Payer: 59 | Source: Ambulatory Visit | Attending: Internal Medicine | Admitting: Internal Medicine

## 2014-08-10 DIAGNOSIS — Z1231 Encounter for screening mammogram for malignant neoplasm of breast: Secondary | ICD-10-CM | POA: Insufficient documentation

## 2014-11-03 ENCOUNTER — Encounter: Payer: Self-pay | Admitting: Internal Medicine

## 2014-11-03 ENCOUNTER — Ambulatory Visit (INDEPENDENT_AMBULATORY_CARE_PROVIDER_SITE_OTHER): Payer: 59 | Admitting: Internal Medicine

## 2014-11-03 VITALS — BP 100/72 | HR 66 | Temp 97.9°F | Wt 110.0 lb

## 2014-11-03 DIAGNOSIS — J0100 Acute maxillary sinusitis, unspecified: Secondary | ICD-10-CM

## 2014-11-03 MED ORDER — LEVOFLOXACIN 500 MG PO TABS: 500.0000 mg | ORAL_TABLET | Freq: Every day | ORAL | Status: DC

## 2014-11-03 NOTE — Patient Instructions (Signed)
Take Levaquin 500 mg daily x 10 days. Take Sudafed PE before flying.

## 2014-11-03 NOTE — Progress Notes (Signed)
   Subjective:    Patient ID: Jacqueline Graham, female    DOB: May 22, 1961, 53 y.o.   MRN: [id number]  HPI  One-week history of URI symptoms. Sounds nasally congested. Has had some discolored nasal drainage. Not much cough. No fever or shaking chills. Is seldom sick. Leaving to go to [facility] on Sunday, December 13. Lost her voice chest her day at work.    Review of Systems     Objective:   Physical Exam  TMs are obscured by cerumen. Pharynx slightly injected without exudate. No cervical adenopathy. Chest clear to auscultation without rales or wheezing. Sounds nasally congested and hoarse.      Assessment & Plan:  Acute sinusitis  Laryngitis  Plan: Levaquin 500 milligrams daily for 10 days. Take Sudafed PE before flying.

## 2014-12-04 ENCOUNTER — Other Ambulatory Visit: Payer: Self-pay | Admitting: *Deleted

## 2014-12-04 MED ORDER — LEVOTHYROXINE SODIUM 50 MCG PO TABS
50.0000 ug | ORAL_TABLET | Freq: Every day | ORAL | Status: DC
Start: 2014-12-04 — End: 2015-06-13

## 2015-01-02 ENCOUNTER — Other Ambulatory Visit: Payer: 59 | Admitting: Internal Medicine

## 2015-01-02 DIAGNOSIS — Z1321 Encounter for screening for nutritional disorder: Secondary | ICD-10-CM

## 2015-01-02 DIAGNOSIS — Z0000 Encounter for general adult medical examination without abnormal findings: Secondary | ICD-10-CM

## 2015-01-02 DIAGNOSIS — Z130 Encounter for screening for diseases of the blood and blood-forming organs and certain disorders involving the immune mechanism: Secondary | ICD-10-CM

## 2015-01-02 DIAGNOSIS — Z1329 Encounter for screening for other suspected endocrine disorder: Secondary | ICD-10-CM

## 2015-01-02 DIAGNOSIS — Z13220 Encounter for screening for lipoid disorders: Secondary | ICD-10-CM

## 2015-01-02 LAB — COMPREHENSIVE METABOLIC PANEL
ALK PHOS: 53 U/L (ref 39–117)
ALT: 18 U/L (ref 0–35)
AST: 25 U/L (ref 0–37)
Albumin: 4 g/dL (ref 3.5–5.2)
BILIRUBIN TOTAL: 0.6 mg/dL (ref 0.2–1.2)
BUN: 13 mg/dL (ref 6–23)
CO2: 28 mEq/L (ref 19–32)
CREATININE: 0.84 mg/dL (ref 0.50–1.10)
Calcium: 9.5 mg/dL (ref 8.4–10.5)
Chloride: 106 mEq/L (ref 96–112)
Glucose, Bld: 74 mg/dL (ref 70–99)
Potassium: 4.9 mEq/L (ref 3.5–5.3)
Sodium: 141 mEq/L (ref 135–145)
Total Protein: 6.5 g/dL (ref 6.0–8.3)

## 2015-01-02 LAB — CBC WITH DIFFERENTIAL/PLATELET
Basophils Absolute: 0 10*3/uL (ref 0.0–0.1)
Basophils Relative: 1 % (ref 0–1)
Eosinophils Absolute: 0.2 10*3/uL (ref 0.0–0.7)
Eosinophils Relative: 5 % (ref 0–5)
HEMATOCRIT: 36 % (ref 36.0–46.0)
HEMOGLOBIN: 12.1 g/dL (ref 12.0–15.0)
LYMPHS ABS: 1.8 10*3/uL (ref 0.7–4.0)
LYMPHS PCT: 38 % (ref 12–46)
MCH: 30.6 pg (ref 26.0–34.0)
MCHC: 33.6 g/dL (ref 30.0–36.0)
MCV: 91.1 fL (ref 78.0–100.0)
MONO ABS: 0.4 10*3/uL (ref 0.1–1.0)
MPV: 8.8 fL (ref 8.6–12.4)
Monocytes Relative: 8 % (ref 3–12)
NEUTROS ABS: 2.3 10*3/uL (ref 1.7–7.7)
Neutrophils Relative %: 48 % (ref 43–77)
Platelets: 190 10*3/uL (ref 150–400)
RBC: 3.95 MIL/uL (ref 3.87–5.11)
RDW: 13.6 % (ref 11.5–15.5)
WBC: 4.8 10*3/uL (ref 4.0–10.5)

## 2015-01-02 LAB — LIPID PANEL
Cholesterol: 151 mg/dL (ref 0–200)
HDL: 67 mg/dL (ref >39)
LDL CALC: 74 mg/dL (ref 0–99)
Total CHOL/HDL Ratio: 2.3 Ratio
Triglycerides: 49 mg/dL (ref <150)
VLDL: 10 mg/dL (ref 0–40)

## 2015-01-02 LAB — TSH: TSH: 1.518 u[IU]/mL (ref 0.350–4.500)

## 2015-01-03 LAB — VITAMIN D 25 HYDROXY (VIT D DEFICIENCY, FRACTURES): VIT D 25 HYDROXY: 36 ng/mL (ref 30–100)

## 2015-01-04 ENCOUNTER — Encounter: Payer: Self-pay | Admitting: Internal Medicine

## 2015-01-04 ENCOUNTER — Ambulatory Visit (INDEPENDENT_AMBULATORY_CARE_PROVIDER_SITE_OTHER): Payer: 59 | Admitting: Internal Medicine

## 2015-01-04 VITALS — BP 100/64 | HR 65 | Temp 98.0°F | Wt 110.0 lb

## 2015-01-04 DIAGNOSIS — R311 Benign essential microscopic hematuria: Secondary | ICD-10-CM | POA: Diagnosis not present

## 2015-01-04 DIAGNOSIS — E785 Hyperlipidemia, unspecified: Secondary | ICD-10-CM | POA: Diagnosis not present

## 2015-01-04 DIAGNOSIS — R8290 Unspecified abnormal findings in urine: Secondary | ICD-10-CM | POA: Diagnosis not present

## 2015-01-04 DIAGNOSIS — Z8669 Personal history of other diseases of the nervous system and sense organs: Secondary | ICD-10-CM

## 2015-01-04 DIAGNOSIS — Z0000 Encounter for general adult medical examination without abnormal findings: Secondary | ICD-10-CM | POA: Diagnosis not present

## 2015-01-04 DIAGNOSIS — I340 Nonrheumatic mitral (valve) insufficiency: Secondary | ICD-10-CM | POA: Diagnosis not present

## 2015-01-04 DIAGNOSIS — E039 Hypothyroidism, unspecified: Secondary | ICD-10-CM

## 2015-01-04 DIAGNOSIS — H409 Unspecified glaucoma: Secondary | ICD-10-CM | POA: Diagnosis not present

## 2015-01-04 LAB — POCT URINALYSIS DIPSTICK
Bilirubin, UA: NEGATIVE
Glucose, UA: NEGATIVE
Ketones, UA: NEGATIVE
LEUKOCYTES UA: NEGATIVE
Nitrite, UA: NEGATIVE
Protein, UA: NEGATIVE
Spec Grav, UA: 1.015
UROBILINOGEN UA: NEGATIVE
pH, UA: 6.5

## 2015-01-05 LAB — URINALYSIS, MICROSCOPIC ONLY
BACTERIA UA: NONE SEEN
Casts: NONE SEEN
Crystals: NONE SEEN
Squamous Epithelial / LPF: NONE SEEN

## 2015-01-06 LAB — URINE CULTURE
COLONY COUNT: NO GROWTH
ORGANISM ID, BACTERIA: NO GROWTH

## 2015-02-09 ENCOUNTER — Other Ambulatory Visit: Payer: Self-pay | Admitting: Internal Medicine

## 2015-03-18 NOTE — Patient Instructions (Signed)
Continue same medications and return in 6 months 

## 2015-03-18 NOTE — Progress Notes (Signed)
   Subjective:    Patient ID: Jacqueline Graham, female    DOB: 01-Dec-1960, 54 y.o.   MRN: [id number]  HPI 54 year old female for health maintenance and evaluation of medical issues. History of hyperlipidemia, microscopic hematuria, hypothyroidism, migraine headaches, glaucoma in both eyes.  Past medical history: Left breast fibroadenoma removed in the mid 1980s. [place] spotted fever 1971. Tetanus immunization 2008.  Has GYN physician  History of palpitations in 2012. Cardiac murmur was discovered at that time. She had mildly thickened leaflets of the mitral valve on 2-D echocardiogram. Aortic valve was normal.  Patient uses Xalatan drops for glaucoma. Takes calcium supplement. Is on thyroid replacement for hypothyroidism and Zocor for hyperlipidemia.  Social history: Nonsmoker. Social alcohol consumption. 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 Has a two-year college degree.  Family history: Father had a stroke and had injury some 6 years ago with history of her degree AV block and syncope. He has a pacemaker. Mother with dementia and history of kidney stones status post acute renal failure after receiving IV contrast. Mother with history of hyperlipidemia, hypertension, hypothyroidism, diabetes mellitus, coronary artery disease status post stent placement. Paternal grandmother with history of colon cancer. Maternal grandmother with history of lymphoma. 3 brothers and one sister. Brother with history of diabetes, hypertension, kidney stones.  Colonoscopy was done December 2015 with repeat study in 10 years    Review of Systems  Constitutional: Negative.   All other systems reviewed and are negative.      Objective:   Physical Exam  Constitutional: She is oriented to person, place, and time. She appears well-developed and well-nourished. No distress.  HENT:  Head: Normocephalic and atraumatic.  Right Ear: External ear normal.  Left Ear: External  ear normal.  Mouth/Throat: Oropharynx is clear and moist. No oropharyngeal exudate.  Eyes: Conjunctivae and EOM are normal. Pupils are equal, round, and reactive to light. Right eye exhibits no discharge. Left eye exhibits no discharge. No scleral icterus.  Neck: Neck supple. No JVD present. No thyromegaly present.  Cardiovascular: Normal rate, regular rhythm, normal heart sounds and intact distal pulses.   No murmur heard. 2/6 systolic ejection murmur  Pulmonary/Chest: Effort normal. No respiratory distress. She has no wheezes. She has no rales.  Breasts normal female  Abdominal: She exhibits no distension and no mass. There is no tenderness. There is no rebound and no guarding.  Genitourinary:  Deferred to GYN  Musculoskeletal: Normal range of motion. She exhibits no edema.  Neurological: She is alert and oriented to person, place, and time. She has normal reflexes. No cranial nerve deficit.  Skin: Skin is warm and dry. No rash noted. She is not diaphoretic.  Psychiatric: She has a normal mood and affect. Her behavior is normal. Judgment and thought content normal.  Vitals reviewed.         Assessment & Plan:  Hypothyroidism-TSH stable on thyroid replacement  Hyperlipidemia-stable on statin medication  History of glaucoma  History of migraine headaches  History of palpitations  History of cardiac murmur with mildly thickened leaflets of mitral valve on 2-D echocardiogram  Plan: Continue same medications and return in 6 months.

## 2015-06-11 ENCOUNTER — Ambulatory Visit (INDEPENDENT_AMBULATORY_CARE_PROVIDER_SITE_OTHER): Payer: 59 | Admitting: Internal Medicine

## 2015-06-11 ENCOUNTER — Encounter: Payer: Self-pay | Admitting: Internal Medicine

## 2015-06-11 VITALS — BP 110/76 | HR 64 | Temp 98.3°F | Ht 61.0 in | Wt 111.0 lb

## 2015-06-11 DIAGNOSIS — R05 Cough: Secondary | ICD-10-CM

## 2015-06-11 DIAGNOSIS — R059 Cough, unspecified: Secondary | ICD-10-CM

## 2015-06-11 MED ORDER — METHYLPREDNISOLONE ACETATE 80 MG/ML IJ SUSP
80.0000 mg | Freq: Once | INTRAMUSCULAR | Status: AC
  Administered 2015-06-11: 80 mg via INTRAMUSCULAR

## 2015-06-11 NOTE — Patient Instructions (Signed)
Depomedrol 80 mg IM given. Uses Flonase nasal spray and take Claritin daily. Have CXR. Take pepcid twice a day.

## 2015-06-13 ENCOUNTER — Other Ambulatory Visit: Payer: Self-pay | Admitting: Internal Medicine

## 2015-06-18 ENCOUNTER — Telehealth: Payer: Self-pay | Admitting: *Deleted

## 2015-06-18 ENCOUNTER — Ambulatory Visit (HOSPITAL_COMMUNITY)
Admission: RE | Admit: 2015-06-18 | Discharge: 2015-06-18 | Disposition: A | Discharge status: Home / Self Care | Payer: 59 | Source: Ambulatory Visit | Attending: Internal Medicine | Admitting: Internal Medicine

## 2015-06-18 DIAGNOSIS — R05 Cough: Secondary | ICD-10-CM | POA: Insufficient documentation

## 2015-06-18 DIAGNOSIS — R059 Cough, unspecified: Secondary | ICD-10-CM

## 2015-06-18 NOTE — Telephone Encounter (Signed)
Reviewed chest xray results with patient.

## 2015-06-23 ENCOUNTER — Encounter: Payer: Self-pay | Admitting: Internal Medicine

## 2015-06-23 NOTE — Progress Notes (Signed)
   Subjective:    Patient ID: Jacqueline Graham, female    DOB: 28-Aug-1961, 54 y.o.   MRN: [id number]  HPI  Issues with persistent cough over the past few weeks. No significant sputum production. No fever or shaking chills. No shortness of breath or wheezing.    Review of Systems     Objective:   Physical Exam  Neck is supple without adenopathy. Chest clear to auscultation without rales or wheezing. Cardiac exam regular rate and rhythm normal S1 and S2. Extremities without edema.      Assessment & Plan:  Protracted cough-? Bronchitis  Possible allergic rhinitis  GE reflux  Plan: Chest x-ray. Take Pepcid twice daily. Depo-Medrol 80 mg IM. Use Flonase nasal spray daily. Take Claritin daily. Call if symptoms persist.

## 2015-08-07 ENCOUNTER — Other Ambulatory Visit: Payer: 59 | Admitting: Internal Medicine

## 2015-08-07 DIAGNOSIS — E785 Hyperlipidemia, unspecified: Secondary | ICD-10-CM

## 2015-08-07 DIAGNOSIS — Z79899 Other long term (current) drug therapy: Secondary | ICD-10-CM

## 2015-08-07 DIAGNOSIS — E039 Hypothyroidism, unspecified: Secondary | ICD-10-CM

## 2015-08-07 LAB — HEPATIC FUNCTION PANEL
ALT: 66 U/L — AB (ref 6–29)
AST: 49 U/L — ABNORMAL HIGH (ref 10–35)
Albumin: 4.3 g/dL (ref 3.6–5.1)
Alkaline Phosphatase: 63 U/L (ref 33–130)
BILIRUBIN DIRECT: 0.1 mg/dL (ref <=0.2)
BILIRUBIN INDIRECT: 0.5 mg/dL (ref 0.2–1.2)
Total Bilirubin: 0.6 mg/dL (ref 0.2–1.2)
Total Protein: 6.6 g/dL (ref 6.1–8.1)

## 2015-08-07 LAB — LIPID PANEL
Cholesterol: 161 mg/dL (ref 125–200)
HDL: 75 mg/dL (ref >=46)
LDL Cholesterol: 77 mg/dL (ref <130)
Total CHOL/HDL Ratio: 2.1 Ratio (ref <=5.0)
Triglycerides: 43 mg/dL (ref <150)
VLDL: 9 mg/dL (ref <30)

## 2015-08-07 LAB — TSH: TSH: 2.264 u[IU]/mL (ref 0.350–4.500)

## 2015-08-09 ENCOUNTER — Encounter: Payer: Self-pay | Admitting: Internal Medicine

## 2015-08-09 ENCOUNTER — Ambulatory Visit (INDEPENDENT_AMBULATORY_CARE_PROVIDER_SITE_OTHER): Payer: 59 | Admitting: Internal Medicine

## 2015-08-09 VITALS — BP 102/60 | HR 68 | Temp 98.9°F | Wt 111.0 lb

## 2015-08-09 DIAGNOSIS — E039 Hypothyroidism, unspecified: Secondary | ICD-10-CM | POA: Diagnosis not present

## 2015-08-09 DIAGNOSIS — R748 Abnormal levels of other serum enzymes: Secondary | ICD-10-CM

## 2015-08-09 DIAGNOSIS — E785 Hyperlipidemia, unspecified: Secondary | ICD-10-CM

## 2015-08-09 DIAGNOSIS — K219 Gastro-esophageal reflux disease without esophagitis: Secondary | ICD-10-CM | POA: Diagnosis not present

## 2015-08-09 MED ORDER — ESOMEPRAZOLE MAGNESIUM 40 MG PO CPDR: 40.0000 mg | DELAYED_RELEASE_CAPSULE | Freq: Every day | ORAL | Status: DC

## 2015-08-09 NOTE — Patient Instructions (Addendum)
Start Nexium daily. Stop pepcid. May need allergy testing.   RTC 6 weeks for LFTs. Continue other meds as prescribed.

## 2015-08-19 DIAGNOSIS — K219 Gastro-esophageal reflux disease without esophagitis: Secondary | ICD-10-CM | POA: Insufficient documentation

## 2015-08-19 NOTE — Progress Notes (Signed)
   Subjective:    Patient ID: Jacqueline Graham, female    DOB: 07/22/1961, 53 y.o.   MRN: [id number]  HPI in today to follow-up on hypothyroidism and hyperlipidemia. Has been to the beach from vacation. Says she probably drank more beer than she really should have been did not follow a strict diet. She has elevated SGOT of 49 and SGPT of 66 which may reflect her recent alcohol consumption. Has not been sick or had viral syndrome. No other medications to really explain the elevated liver functions Lipid panel and TSH are normal. Having issues with GE reflux. Has been taking Pepcid. Having issues with cough and hoarseness.   Review of Systems     Objective:   Physical Exam No hepatosplenomegaly masses or tenderness on abdominal exam       Assessment & Plan:  Hypothyroidism-TSH stable. Continue same dose of thyroid replacement  Elevated liver enzymes-these are only mildly elevated. Follow-up in 6 weeks with liver panel only. Watch alcohol consumption.  Hyperlipidemia-lipid panel normal. Continue statin medication  GE reflux-try Nexium instead of Pepcid. May need to have allergy testing for cough.  Plan: Repeat liver functions in 6 weeks without office visit. Booked physical exam  March 2017.

## 2015-10-02 ENCOUNTER — Other Ambulatory Visit: Payer: Self-pay | Admitting: Internal Medicine

## 2015-10-02 LAB — HEPATIC FUNCTION PANEL
ALK PHOS: 66 U/L (ref 33–130)
ALT: 19 U/L (ref 6–29)
AST: 25 U/L (ref 10–35)
Albumin: 4.2 g/dL (ref 3.6–5.1)
BILIRUBIN DIRECT: 0.1 mg/dL (ref <=0.2)
BILIRUBIN INDIRECT: 0.2 mg/dL (ref 0.2–1.2)
BILIRUBIN TOTAL: 0.3 mg/dL (ref 0.2–1.2)
Total Protein: 6.6 g/dL (ref 6.1–8.1)

## 2015-12-17 ENCOUNTER — Other Ambulatory Visit: Payer: Self-pay | Admitting: Internal Medicine

## 2015-12-17 MED FILL — LEVOTHYROXINE 50 MCG TABLET: 50 | 90 days supply | Qty: 90 | Fill #0

## 2015-12-17 MED FILL — LATANOPROST 0.005% EYE DRP: 0.005 | 90 days supply | Qty: 8 | Fill #3

## 2015-12-17 MED FILL — SIMVASTATIN 20 MG TABLET: 20 | 90 days supply | Qty: 90 | Fill #3

## 2016-01-29 ENCOUNTER — Other Ambulatory Visit: Payer: 59 | Admitting: Internal Medicine

## 2016-01-29 DIAGNOSIS — Z1321 Encounter for screening for nutritional disorder: Secondary | ICD-10-CM | POA: Diagnosis not present

## 2016-01-29 DIAGNOSIS — E785 Hyperlipidemia, unspecified: Secondary | ICD-10-CM | POA: Diagnosis not present

## 2016-01-29 DIAGNOSIS — E039 Hypothyroidism, unspecified: Secondary | ICD-10-CM | POA: Diagnosis not present

## 2016-01-29 DIAGNOSIS — Z130 Encounter for screening for diseases of the blood and blood-forming organs and certain disorders involving the immune mechanism: Secondary | ICD-10-CM | POA: Diagnosis not present

## 2016-01-29 DIAGNOSIS — Z79899 Other long term (current) drug therapy: Secondary | ICD-10-CM | POA: Diagnosis not present

## 2016-01-29 DIAGNOSIS — Z0000 Encounter for general adult medical examination without abnormal findings: Secondary | ICD-10-CM | POA: Diagnosis not present

## 2016-01-29 LAB — CBC WITH DIFFERENTIAL/PLATELET
BASOS ABS: 0.1 10*3/uL (ref 0.0–0.1)
Basophils Relative: 1 % (ref 0–1)
Eosinophils Absolute: 0.3 10*3/uL (ref 0.0–0.7)
Eosinophils Relative: 6 % — ABNORMAL HIGH (ref 0–5)
HEMATOCRIT: 38.2 % (ref 36.0–46.0)
HEMOGLOBIN: 12.9 g/dL (ref 12.0–15.0)
LYMPHS PCT: 39 % (ref 12–46)
Lymphs Abs: 2.2 10*3/uL (ref 0.7–4.0)
MCH: 30.3 pg (ref 26.0–34.0)
MCHC: 33.8 g/dL (ref 30.0–36.0)
MCV: 89.7 fL (ref 78.0–100.0)
MPV: 8.6 fL (ref 8.6–12.4)
Monocytes Absolute: 0.5 10*3/uL (ref 0.1–1.0)
Monocytes Relative: 9 % (ref 3–12)
NEUTROS ABS: 2.6 10*3/uL (ref 1.7–7.7)
NEUTROS PCT: 45 % (ref 43–77)
Platelets: 283 10*3/uL (ref 150–400)
RBC: 4.26 MIL/uL (ref 3.87–5.11)
RDW: 13.1 % (ref 11.5–15.5)
WBC: 5.7 10*3/uL (ref 4.0–10.5)

## 2016-01-29 LAB — COMPLETE METABOLIC PANEL WITH GFR
ALBUMIN: 4.1 g/dL (ref 3.6–5.1)
ALK PHOS: 65 U/L (ref 33–130)
ALT: 16 U/L (ref 6–29)
AST: 23 U/L (ref 10–35)
BUN: 14 mg/dL (ref 7–25)
CALCIUM: 9.9 mg/dL (ref 8.6–10.4)
CHLORIDE: 103 mmol/L (ref 98–110)
CO2: 28 mmol/L (ref 20–31)
Creat: 1 mg/dL (ref 0.50–1.05)
GFR, Est African American: 74 mL/min (ref >=60)
GFR, Est Non African American: 64 mL/min (ref >=60)
GLUCOSE: 76 mg/dL (ref 65–99)
Potassium: 5.3 mmol/L (ref 3.5–5.3)
Sodium: 140 mmol/L (ref 135–146)
Total Bilirubin: 0.4 mg/dL (ref 0.2–1.2)
Total Protein: 6.7 g/dL (ref 6.1–8.1)

## 2016-01-29 LAB — LIPID PANEL
CHOL/HDL RATIO: 2.5 ratio (ref <=5.0)
Cholesterol: 160 mg/dL (ref 125–200)
HDL: 64 mg/dL (ref >=46)
LDL CALC: 82 mg/dL (ref <130)
TRIGLYCERIDES: 71 mg/dL (ref <150)
VLDL: 14 mg/dL (ref <30)

## 2016-01-29 LAB — TSH: TSH: 2.52 mIU/L

## 2016-01-30 LAB — VITAMIN D 25 HYDROXY (VIT D DEFICIENCY, FRACTURES): Vit D, 25-Hydroxy: 33 ng/mL (ref 30–100)

## 2016-01-31 ENCOUNTER — Encounter: Payer: Self-pay | Admitting: Internal Medicine

## 2016-01-31 ENCOUNTER — Ambulatory Visit (INDEPENDENT_AMBULATORY_CARE_PROVIDER_SITE_OTHER): Payer: 59 | Admitting: Internal Medicine

## 2016-01-31 VITALS — BP 118/68 | HR 74 | Temp 97.5°F | Resp 18 | Ht 61.0 in | Wt 112.0 lb

## 2016-01-31 DIAGNOSIS — J309 Allergic rhinitis, unspecified: Secondary | ICD-10-CM

## 2016-01-31 DIAGNOSIS — E039 Hypothyroidism, unspecified: Secondary | ICD-10-CM | POA: Diagnosis not present

## 2016-01-31 DIAGNOSIS — Z8249 Family history of ischemic heart disease and other diseases of the circulatory system: Secondary | ICD-10-CM | POA: Diagnosis not present

## 2016-01-31 DIAGNOSIS — Z8669 Personal history of other diseases of the nervous system and sense organs: Secondary | ICD-10-CM

## 2016-01-31 DIAGNOSIS — H409 Unspecified glaucoma: Secondary | ICD-10-CM | POA: Diagnosis not present

## 2016-01-31 DIAGNOSIS — E785 Hyperlipidemia, unspecified: Secondary | ICD-10-CM | POA: Diagnosis not present

## 2016-01-31 DIAGNOSIS — R3129 Other microscopic hematuria: Secondary | ICD-10-CM | POA: Diagnosis not present

## 2016-01-31 DIAGNOSIS — Z0000 Encounter for general adult medical examination without abnormal findings: Secondary | ICD-10-CM

## 2016-01-31 MED ORDER — MONTELUKAST SODIUM 10 MG PO TABS: 10.0000 mg | ORAL_TABLET | Freq: Every day | ORAL | Status: DC

## 2016-01-31 MED FILL — MONTELUKAST SOD 10 MG TAB: 10 | 90 days supply | Qty: 90 | Fill #0

## 2016-01-31 NOTE — Progress Notes (Signed)
Subjective:    Patient ID: Jacqueline Graham, female    DOB: 06-03-1961, 55 y.o.   MRN: [id number]  HPI 55 year old White Female in today for health maintenance exam and evaluation of medical issues including hypothyroidism and hyperlipidemia. She feels well and has no new complaints. She has a history of GE reflux treated with Nexium. She has a history of allergic rhinitis treated with Zyrtec and Flonase but feels that allergies are continuing to bother her. She would like to try Singulair. Blood pressure is excellent. She has no history of hypertension. She has a history of migraine headaches and benign microscopic hematuria. Has glaucoma in both eyes.  Past medical history: Left breast fibroadenoma removed in the mid 1980s. [place] fever 1971.  Tetanus immunization 2008.  She has GYN physician.  History of palpitations and 2012. Cardiac murmur was discovered at that time. She had mildly thickened leaflets of the mitral valve on 2-D echocardiogram. Aortic valve was normal.  She uses Xalatan drops for glaucoma. Is on thyroid replacement for hypothyroidism and Zocor for hyperlipidemia.  Social history: Nonsmoker. Social alcohol consumption. 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 Has a 2 year college degree.  Colonoscopy was done December 2015 with repeat study in 10 years recommended.  Family history: Father had a stroke. He has a pacemaker. History of third-degree AV block and syncope. Mother with dementia and history of kidney stones status post acute renal failure after receiving IV contrast. Mother with history of hyperlipidemia, hypertension, hypothyroidism, diabetes mellitus, coronary artery disease status post stent placement. Paternal grandmother with history of colon cancer. Maternal grandmother with history of lymphoma. 3 brothers and 1 sister. Brother with history of diabetes, hypertension, kidney stones.     Review of Systems    Constitutional: Negative.   All other systems reviewed and are negative.      Objective:   Physical Exam  Constitutional: She is oriented to person, place, and time. She appears well-developed and well-nourished. No distress.  HENT:  Head: Normocephalic and atraumatic.  Right Ear: External ear normal.  Left Ear: External ear normal.  Mouth/Throat: Oropharynx is clear and moist. No oropharyngeal exudate.  Eyes: Conjunctivae and EOM are normal. Pupils are equal, round, and reactive to light. Right eye exhibits no discharge. Left eye exhibits no discharge. No scleral icterus.  Neck: Neck supple. No JVD present. No thyromegaly present.  Cardiovascular: Normal rate, regular rhythm, normal heart sounds and intact distal pulses.   No murmur heard. Pulmonary/Chest: Effort normal and breath sounds normal. No respiratory distress. She has no wheezes. She exhibits no tenderness.  Breasts normal female  Abdominal: Soft. Bowel sounds are normal. She exhibits no distension and no mass. There is no tenderness. There is no rebound and no guarding.  Genitourinary:  Deferred to GYN physician  Musculoskeletal: She exhibits no edema.  Lymphadenopathy:    She has no cervical adenopathy.  Neurological: She is alert and oriented to person, place, and time. She has normal reflexes. No cranial nerve deficit. Coordination normal.  Skin: Skin is warm and dry. No rash noted. She is not diaphoretic.  Psychiatric: She has a normal mood and affect. Her behavior is normal. Judgment and thought content normal.  Vitals reviewed.         Assessment & Plan:  Normal health maintenance exam  Hyperlipidemia-lipid panel normal on statin therapy  Glaucoma  Hypothyroidism stable on thyroid replacement therapy  History of migraine headaches  Family history of heart disease  Microscopic hematuria  History of benign cardiac murmur  Allergic rhinitis-trial of Singulair in addition to Zyrtec and steroid nasal  spray. Patient complains of some coughing which previously has been treated with PPI  GE reflux-continue PPI  Plan: 6 months ago had mild elevation of SGOT and SGPT. These are now normal. Lipid panel normal. TSH is normal. Return in 6 months for office visit lipid panel liver functions and TSH.

## 2016-02-21 ENCOUNTER — Encounter: Payer: Self-pay | Admitting: Internal Medicine

## 2016-02-21 DIAGNOSIS — Z8669 Personal history of other diseases of the nervous system and sense organs: Secondary | ICD-10-CM | POA: Insufficient documentation

## 2016-02-21 DIAGNOSIS — J309 Allergic rhinitis, unspecified: Secondary | ICD-10-CM | POA: Insufficient documentation

## 2016-02-21 DIAGNOSIS — Z8249 Family history of ischemic heart disease and other diseases of the circulatory system: Secondary | ICD-10-CM | POA: Insufficient documentation

## 2016-02-21 NOTE — Patient Instructions (Signed)
It was a pleasure to see you today. Continue same medications and return in 6 months. Try Singulair 10 mg daily for allergic rhinitis and continue same medications as previously prescribed.

## 2016-03-27 ENCOUNTER — Other Ambulatory Visit: Payer: Self-pay | Admitting: Internal Medicine

## 2016-03-27 MED FILL — SIMVASTATIN 20 MG TABLET: 20 | 90 days supply | Qty: 90 | Fill #0

## 2016-03-27 MED FILL — LATANOPROST 0.005% EYE DRP: 0.005 | 90 days supply | Qty: 8 | Fill #0

## 2016-03-27 MED FILL — LEVOTHYROXINE 50 MCG TABLET: 50 | 90 days supply | Qty: 90 | Fill #1

## 2016-04-10 DIAGNOSIS — H5203 Hypermetropia, bilateral: Secondary | ICD-10-CM | POA: Diagnosis not present

## 2016-04-10 DIAGNOSIS — H401131 Primary open-angle glaucoma, bilateral, mild stage: Secondary | ICD-10-CM | POA: Diagnosis not present

## 2016-04-10 DIAGNOSIS — H524 Presbyopia: Secondary | ICD-10-CM | POA: Diagnosis not present

## 2016-05-02 MED FILL — MONTELUKAST SOD 10 MG TAB: 10 | 90 days supply | Qty: 90 | Fill #1

## 2016-06-30 ENCOUNTER — Other Ambulatory Visit: Payer: Self-pay | Admitting: Internal Medicine

## 2016-06-30 DIAGNOSIS — Z1231 Encounter for screening mammogram for malignant neoplasm of breast: Secondary | ICD-10-CM

## 2016-07-01 ENCOUNTER — Ambulatory Visit
Admission: RE | Admit: 2016-07-01 | Discharge: 2016-07-01 | Disposition: A | Discharge status: Home / Self Care | Payer: 59 | Source: Ambulatory Visit | Attending: Internal Medicine | Admitting: Internal Medicine

## 2016-07-01 DIAGNOSIS — Z1231 Encounter for screening mammogram for malignant neoplasm of breast: Secondary | ICD-10-CM

## 2016-07-08 ENCOUNTER — Other Ambulatory Visit: Payer: Self-pay | Admitting: Internal Medicine

## 2016-07-08 MED FILL — SIMVASTATIN 20 MG TABLET: 20 | 90 days supply | Qty: 90 | Fill #1

## 2016-07-08 MED FILL — LEVOTHYROXINE 50 MCG TABLET: 50 | 90 days supply | Qty: 90 | Fill #0

## 2016-08-05 ENCOUNTER — Other Ambulatory Visit: Payer: 59 | Admitting: Internal Medicine

## 2016-08-05 ENCOUNTER — Other Ambulatory Visit: Payer: Self-pay | Admitting: Internal Medicine

## 2016-08-05 DIAGNOSIS — E785 Hyperlipidemia, unspecified: Secondary | ICD-10-CM

## 2016-08-05 DIAGNOSIS — N6019 Diffuse cystic mastopathy of unspecified breast: Secondary | ICD-10-CM | POA: Diagnosis not present

## 2016-08-05 DIAGNOSIS — Z8669 Personal history of other diseases of the nervous system and sense organs: Secondary | ICD-10-CM | POA: Diagnosis not present

## 2016-08-05 DIAGNOSIS — E039 Hypothyroidism, unspecified: Secondary | ICD-10-CM

## 2016-08-05 DIAGNOSIS — H409 Unspecified glaucoma: Secondary | ICD-10-CM

## 2016-08-05 MED FILL — MONTELUKAST SOD 10 MG TAB: 10 | 90 days supply | Qty: 90 | Fill #0

## 2016-08-06 LAB — LIPID PANEL
Cholesterol: 165 mg/dL (ref 125–200)
HDL: 76 mg/dL (ref >=46)
LDL CALC: 79 mg/dL (ref <130)
Total CHOL/HDL Ratio: 2.2 Ratio (ref <=5.0)
Triglycerides: 49 mg/dL (ref <150)
VLDL: 10 mg/dL (ref <30)

## 2016-08-06 LAB — HEPATIC FUNCTION PANEL
ALBUMIN: 4 g/dL (ref 3.6–5.1)
ALK PHOS: 56 U/L (ref 33–130)
ALT: 12 U/L (ref 6–29)
AST: 21 U/L (ref 10–35)
BILIRUBIN DIRECT: 0.1 mg/dL (ref <=0.2)
BILIRUBIN TOTAL: 0.4 mg/dL (ref 0.2–1.2)
Indirect Bilirubin: 0.3 mg/dL (ref 0.2–1.2)
Total Protein: 6.3 g/dL (ref 6.1–8.1)

## 2016-08-06 LAB — TSH: TSH: 2.29 mIU/L

## 2016-08-07 ENCOUNTER — Encounter: Payer: Self-pay | Admitting: Internal Medicine

## 2016-08-07 ENCOUNTER — Ambulatory Visit (INDEPENDENT_AMBULATORY_CARE_PROVIDER_SITE_OTHER): Payer: 59 | Admitting: Internal Medicine

## 2016-08-07 VITALS — BP 100/64 | HR 63 | Temp 98.0°F | Ht 61.0 in | Wt 110.0 lb

## 2016-08-07 DIAGNOSIS — J309 Allergic rhinitis, unspecified: Secondary | ICD-10-CM

## 2016-08-07 DIAGNOSIS — E785 Hyperlipidemia, unspecified: Secondary | ICD-10-CM

## 2016-08-07 DIAGNOSIS — E039 Hypothyroidism, unspecified: Secondary | ICD-10-CM | POA: Diagnosis not present

## 2016-08-07 DIAGNOSIS — H409 Unspecified glaucoma: Secondary | ICD-10-CM

## 2016-08-18 MED FILL — LATANOPROST 0.005% EYE DRP: 0.005 | 90 days supply | Qty: 8 | Fill #1

## 2016-08-20 NOTE — Patient Instructions (Signed)
He was pleasure to see you today. Have flu vaccine at work. Continue same medications and return in 6 months for physical examination.

## 2016-08-20 NOTE — Progress Notes (Signed)
   Subjective:    Patient ID: Renato Gails, female    DOB: [birth year], 55 y.o.   MRN: [id number]  HPI 55 year old Female in today for six-month recheck. History of hyperlipidemia maintained on Zocor and hypothyroidism treated with Synthroid. She feels well and has no complaints. Blood pressure is excellent 100/64. History of GE reflux treated with Nexium and allergic rhinitis treated with Zyrtec and Flonase. We also added Singulair 4 allergy symptoms and that helped a great deal. She has a history of migraine headaches and glaucoma in both eyes.  History of cardiac murmur with mildly thickened leaflets of the mitral valve on 2-D echocardiogram. This was found because of complaint of palpitations and 2012.  History of left breast fibroadenoma removed in the 1980s.    Review of Systems     Objective:   Physical Exam Skin warm and dry. Nodes none. Neck is supple without JVD thyromegaly or carotid bruits. Chest clear. Cardiac exam regular rate and rhythm normal S1 and S2. Extremity is without edema       Assessment & Plan:  Hyperlipidemia-stable on statin medication  Hypothyroidism-TSH normal on thyroid replacement  Allergic rhinitis-symptoms stable per [place] after adding Singulair  Plan: Return in 6 months for physical examination or as needed. Will get flu vaccine through employment.

## 2016-10-13 ENCOUNTER — Other Ambulatory Visit: Payer: Self-pay | Admitting: Internal Medicine

## 2016-10-13 MED FILL — SIMVASTATIN 20 MG TABLET: 20 | 90 days supply | Qty: 90 | Fill #0

## 2016-10-13 MED FILL — LEVOTHYROXINE 50 MCG TABLET: 50 | 90 days supply | Qty: 90 | Fill #1

## 2016-10-23 DIAGNOSIS — H401131 Primary open-angle glaucoma, bilateral, mild stage: Secondary | ICD-10-CM | POA: Diagnosis not present

## 2016-10-31 MED FILL — MONTELUKAST SOD 10 MG TAB: 10 | 90 days supply | Qty: 90 | Fill #1

## 2016-11-20 MED FILL — LATANOPROST 0.005% EYE DRP: 0.005 | 90 days supply | Qty: 8 | Fill #2

## 2017-01-16 ENCOUNTER — Other Ambulatory Visit: Payer: Self-pay | Admitting: Internal Medicine

## 2017-01-16 MED FILL — SIMVASTATIN 20 MG TABLET: 20 | 90 days supply | Qty: 90 | Fill #1

## 2017-01-16 MED FILL — LEVOTHYROXINE 50 MCG TABLET: 50 | 90 days supply | Qty: 90 | Fill #0

## 2017-02-10 ENCOUNTER — Other Ambulatory Visit: Payer: 59 | Admitting: Internal Medicine

## 2017-02-10 DIAGNOSIS — Z13220 Encounter for screening for lipoid disorders: Secondary | ICD-10-CM | POA: Diagnosis not present

## 2017-02-10 DIAGNOSIS — Z1321 Encounter for screening for nutritional disorder: Secondary | ICD-10-CM | POA: Diagnosis not present

## 2017-02-10 DIAGNOSIS — Z1329 Encounter for screening for other suspected endocrine disorder: Secondary | ICD-10-CM

## 2017-02-10 DIAGNOSIS — Z0000 Encounter for general adult medical examination without abnormal findings: Secondary | ICD-10-CM | POA: Diagnosis not present

## 2017-02-10 LAB — LIPID PANEL
CHOL/HDL RATIO: 2.3 ratio (ref <5.0)
Cholesterol: 170 mg/dL (ref <200)
HDL: 73 mg/dL (ref >50)
LDL CALC: 81 mg/dL (ref <100)
TRIGLYCERIDES: 79 mg/dL (ref <150)
VLDL: 16 mg/dL (ref <30)

## 2017-02-10 LAB — CBC WITH DIFFERENTIAL/PLATELET
BASOS ABS: 0 {cells}/uL (ref 0–200)
Basophils Relative: 0 %
EOS ABS: 270 {cells}/uL (ref 15–500)
Eosinophils Relative: 5 %
HEMATOCRIT: 39.5 % (ref 35.0–45.0)
Hemoglobin: 12.9 g/dL (ref 11.7–15.5)
LYMPHS PCT: 32 %
Lymphs Abs: 1728 cells/uL (ref 850–3900)
MCH: 29.9 pg (ref 27.0–33.0)
MCHC: 32.7 g/dL (ref 32.0–36.0)
MCV: 91.4 fL (ref 80.0–100.0)
MONO ABS: 486 {cells}/uL (ref 200–950)
MPV: 9 fL (ref 7.5–12.5)
Monocytes Relative: 9 %
NEUTROS PCT: 54 %
Neutro Abs: 2916 cells/uL (ref 1500–7800)
Platelets: 221 10*3/uL (ref 140–400)
RBC: 4.32 MIL/uL (ref 3.80–5.10)
RDW: 13.7 % (ref 11.0–15.0)
WBC: 5.4 10*3/uL (ref 3.8–10.8)

## 2017-02-10 LAB — TSH: TSH: 1.87 m[IU]/L

## 2017-02-10 LAB — COMPREHENSIVE METABOLIC PANEL
ALK PHOS: 58 U/L (ref 33–130)
ALT: 14 U/L (ref 6–29)
AST: 21 U/L (ref 10–35)
Albumin: 4.5 g/dL (ref 3.6–5.1)
BUN: 17 mg/dL (ref 7–25)
CO2: 27 mmol/L (ref 20–31)
Calcium: 9.9 mg/dL (ref 8.6–10.4)
Chloride: 105 mmol/L (ref 98–110)
Creat: 1.1 mg/dL — ABNORMAL HIGH (ref 0.50–1.05)
GLUCOSE: 77 mg/dL (ref 65–99)
POTASSIUM: 5.1 mmol/L (ref 3.5–5.3)
Sodium: 140 mmol/L (ref 135–146)
Total Bilirubin: 0.5 mg/dL (ref 0.2–1.2)
Total Protein: 6.8 g/dL (ref 6.1–8.1)

## 2017-02-11 LAB — VITAMIN D 25 HYDROXY (VIT D DEFICIENCY, FRACTURES): Vit D, 25-Hydroxy: 38 ng/mL (ref 30–100)

## 2017-02-13 ENCOUNTER — Encounter: Payer: Self-pay | Admitting: Internal Medicine

## 2017-02-13 ENCOUNTER — Ambulatory Visit (INDEPENDENT_AMBULATORY_CARE_PROVIDER_SITE_OTHER): Payer: 59 | Admitting: Internal Medicine

## 2017-02-13 ENCOUNTER — Other Ambulatory Visit (HOSPITAL_COMMUNITY)
Admission: RE | Admit: 2017-02-13 | Discharge: 2017-02-13 | Disposition: A | Discharge status: Home / Self Care | Payer: 59 | Source: Ambulatory Visit | Attending: Internal Medicine | Admitting: Internal Medicine

## 2017-02-13 VITALS — BP 90/60 | HR 56 | Temp 97.0°F | Ht 59.5 in | Wt 110.0 lb

## 2017-02-13 DIAGNOSIS — E784 Other hyperlipidemia: Secondary | ICD-10-CM

## 2017-02-13 DIAGNOSIS — E039 Hypothyroidism, unspecified: Secondary | ICD-10-CM

## 2017-02-13 DIAGNOSIS — Z01419 Encounter for gynecological examination (general) (routine) without abnormal findings: Secondary | ICD-10-CM | POA: Diagnosis not present

## 2017-02-13 DIAGNOSIS — J302 Other seasonal allergic rhinitis: Secondary | ICD-10-CM | POA: Diagnosis not present

## 2017-02-13 DIAGNOSIS — Z0000 Encounter for general adult medical examination without abnormal findings: Secondary | ICD-10-CM | POA: Diagnosis not present

## 2017-02-13 DIAGNOSIS — Z8249 Family history of ischemic heart disease and other diseases of the circulatory system: Secondary | ICD-10-CM | POA: Diagnosis not present

## 2017-02-13 DIAGNOSIS — K219 Gastro-esophageal reflux disease without esophagitis: Secondary | ICD-10-CM

## 2017-02-13 DIAGNOSIS — Z124 Encounter for screening for malignant neoplasm of cervix: Secondary | ICD-10-CM | POA: Diagnosis not present

## 2017-02-13 DIAGNOSIS — R3129 Other microscopic hematuria: Secondary | ICD-10-CM | POA: Diagnosis not present

## 2017-02-13 DIAGNOSIS — E7849 Other hyperlipidemia: Secondary | ICD-10-CM

## 2017-02-13 LAB — POCT URINALYSIS DIPSTICK
BILIRUBIN UA: NEGATIVE
GLUCOSE UA: NEGATIVE
KETONES UA: NEGATIVE
Leukocytes, UA: NEGATIVE
Nitrite, UA: NEGATIVE
PH UA: 6 (ref 5.0–8.0)
Protein, UA: NEGATIVE
Spec Grav, UA: 1.01 (ref 1.030–1.035)
Urobilinogen, UA: 0.2 (ref ?–2.0)

## 2017-02-13 MED ORDER — MONTELUKAST SODIUM 10 MG PO TABS: 10.0000 mg | ORAL_TABLET | Freq: Every day | ORAL | 1 refills | Status: DC

## 2017-02-13 MED FILL — MONTELUKAST SOD 10 MG TAB: 10 | 90 days supply | Qty: 90 | Fill #0

## 2017-02-17 LAB — CYTOLOGY - PAP: DIAGNOSIS: NEGATIVE

## 2017-02-20 NOTE — Progress Notes (Signed)
Subjective:    Patient ID: Jacqueline Graham, female    DOB: 1961/10/05, 56 y.o.   MRN: [id number]  HPI Pleasant 56 year old Female in today for health maintenance exam. Patient had colonoscopy in the past by Dr. Collene Mares. We do not have record of colonoscopy in Epic. We will call and request records with date of last colonoscopy.We think study was done December 2015.  She has a history of hypothyroidism and hyperlipidemia. Feels well. GE reflux treated with Nexium. History of allergic rhinitis treated with Zyrtec and Flonase. Had a cough at one point but that improved after trying Singulair. History of migraine headaches. Has glaucoma both eyes.  Past medical history: Left breast fibroadenoma removed in the mid 1980s. [place] fever 1971.  Prefers it GYN checkup be done here.  History of palpitations in 2012. Cardiac murmur was discovered at the time. She had mildly thickened leaflets of the mitral valve on 2-D echo. Aortic valve was normal.  She uses Xalatan eyedrops for glaucoma.  Social history: Nonsmoker. Social alcohol consumption. 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 Has 2 year college degree.  Family history: Father had a stroke. He has pacemaker. History of third-degree AV block and syncope. Mother with history of dementia, kidney stones, status post acute renal failure after receiving IV contrast dye. Mother also has hyperlipidemia, hypertension, hypothyroidism, diabetes, coronary artery disease status post stent placement. Paternal grandmother with history of colon cancer. Maternal grandmother with history of lymphoma. 3 brothers and 1 sister. One Brother has history of diabetes, hypertension, kidney stones.    Review of Systems  Constitutional: Negative.   All other systems reviewed and are negative.      Objective:   Physical Exam  Constitutional: She is oriented to person, place, and time. She appears well-developed and  well-nourished. No distress.  HENT:  Head: Normocephalic and atraumatic.  Right Ear: External ear normal.  Left Ear: External ear normal.  Mouth/Throat: Oropharynx is clear and moist.  Eyes: Conjunctivae and EOM are normal. Pupils are equal, round, and reactive to light. Right eye exhibits no discharge. Left eye exhibits no discharge. No scleral icterus.  Neck: Neck supple. No JVD present. No thyromegaly present.  Cardiovascular: Normal rate, regular rhythm and intact distal pulses.   Murmur heard. 1/6 systolic ejection  Pulmonary/Chest: Effort normal and breath sounds normal. No respiratory distress. She has no wheezes. She has no rales. She exhibits no tenderness.  Breasts normal female  Abdominal: Soft. Bowel sounds are normal. She exhibits no distension and no mass. There is no tenderness. There is no rebound and no guarding.  Genitourinary:  Genitourinary Comments: Pap taken. Bimanual normal.  Musculoskeletal: She exhibits no edema.  Lymphadenopathy:    She has no cervical adenopathy.  Neurological: She is alert and oriented to person, place, and time. She displays normal reflexes. No cranial nerve deficit. Coordination normal.  Skin: Skin is warm and dry. No rash noted. She is not diaphoretic.  Psychiatric: She has a normal mood and affect. Her behavior is normal. Judgment and thought content normal.  Vitals reviewed.         Assessment & Plan:  Hypothyroidism-stable on thyroid replacement  Hyperlipidemia-normal lipid panel on statin therapy  History of glaucoma  History of migraine headaches  Family history of heart disease  History of  cardiac murmur-mildly thickened leaflets of mitral valve on 2-D echocardiogram in 2012.  Allergic rhinitis  History of benign microscopic hematuria. Urinalysis normal  today.  Plan: Continue same medications and return in 6 months. Very mild elevation of serum creatinine 1.10. Creatinine was 1.00 last year.

## 2017-02-20 NOTE — Patient Instructions (Signed)
It was a pleasure to see you today.  Continue same medications and return in 6 months. 

## 2017-03-26 DIAGNOSIS — H401131 Primary open-angle glaucoma, bilateral, mild stage: Secondary | ICD-10-CM | POA: Diagnosis not present

## 2017-03-26 MED FILL — LATANOPROST 0.005% EYE DRP: 0.005 | 90 days supply | Qty: 8 | Fill #3

## 2017-04-23 MED FILL — SIMVASTATIN 20 MG TABLET: 20 | 90 days supply | Qty: 90 | Fill #2

## 2017-04-23 MED FILL — LEVOTHYROXINE 50 MCG TABLET: 50 | 90 days supply | Qty: 90 | Fill #1

## 2017-05-20 MED FILL — MONTELUKAST SOD 10 MG TAB: 10 | 90 days supply | Qty: 90 | Fill #1

## 2017-08-05 ENCOUNTER — Telehealth: Payer: Self-pay

## 2017-08-05 MED ORDER — LEVOTHYROXINE SODIUM 50 MCG PO TABS: 50.0000 ug | ORAL_TABLET | Freq: Every day | ORAL | 0 refills | Status: DC

## 2017-08-05 MED FILL — LEVOTHYROXINE 50 MCG TABLET: 50 | 90 days supply | Qty: 90 | Fill #0

## 2017-08-05 MED FILL — SIMVASTATIN 20 MG TABLET: 20 | 90 days supply | Qty: 90 | Fill #3

## 2017-08-05 NOTE — Telephone Encounter (Signed)
Received fax from [place] in regards to a refill on Synthroid 21mcg for patient. Medication was refilled per Dr. Verlene Mayer request. Sent 90 day supply

## 2017-08-13 ENCOUNTER — Other Ambulatory Visit: Payer: 59 | Admitting: Internal Medicine

## 2017-08-13 DIAGNOSIS — E039 Hypothyroidism, unspecified: Secondary | ICD-10-CM | POA: Diagnosis not present

## 2017-08-13 DIAGNOSIS — E785 Hyperlipidemia, unspecified: Secondary | ICD-10-CM

## 2017-08-13 LAB — HEPATIC FUNCTION PANEL
AG Ratio: 1.9 (calc) (ref 1.0–2.5)
ALT: 16 U/L (ref 6–29)
AST: 24 U/L (ref 10–35)
Albumin: 4.4 g/dL (ref 3.6–5.1)
Alkaline phosphatase (APISO): 65 U/L (ref 33–130)
Bilirubin, Direct: 0.1 mg/dL (ref 0.0–0.2)
GLOBULIN: 2.3 g/dL (ref 1.9–3.7)
Indirect Bilirubin: 0.4 mg/dL (calc) (ref 0.2–1.2)
Total Bilirubin: 0.5 mg/dL (ref 0.2–1.2)
Total Protein: 6.7 g/dL (ref 6.1–8.1)

## 2017-08-13 LAB — EXTRA LAV TOP TUBE

## 2017-08-13 LAB — LIPID PANEL
Cholesterol: 162 mg/dL (ref <200)
HDL: 79 mg/dL (ref >50)
LDL Cholesterol (Calc): 71 mg/dL (calc)
NON-HDL CHOLESTEROL (CALC): 83 mg/dL (ref <130)
Total CHOL/HDL Ratio: 2.1 (calc) (ref <5.0)
Triglycerides: 43 mg/dL (ref <150)

## 2017-08-13 LAB — TSH: TSH: 1.9 m[IU]/L

## 2017-08-17 MED FILL — LATANOPROST 0.005% EYE DRP: 0.005 | 90 days supply | Qty: 8 | Fill #0

## 2017-08-20 ENCOUNTER — Ambulatory Visit (INDEPENDENT_AMBULATORY_CARE_PROVIDER_SITE_OTHER): Payer: 59 | Admitting: Internal Medicine

## 2017-08-20 ENCOUNTER — Encounter: Payer: Self-pay | Admitting: Internal Medicine

## 2017-08-20 VITALS — BP 92/58 | HR 86 | Temp 98.2°F | Wt 110.0 lb

## 2017-08-20 DIAGNOSIS — K219 Gastro-esophageal reflux disease without esophagitis: Secondary | ICD-10-CM

## 2017-08-20 DIAGNOSIS — E7849 Other hyperlipidemia: Secondary | ICD-10-CM

## 2017-08-20 DIAGNOSIS — E039 Hypothyroidism, unspecified: Secondary | ICD-10-CM | POA: Diagnosis not present

## 2017-08-20 DIAGNOSIS — H4010X1 Unspecified open-angle glaucoma, mild stage: Secondary | ICD-10-CM

## 2017-08-20 DIAGNOSIS — E784 Other hyperlipidemia: Secondary | ICD-10-CM | POA: Diagnosis not present

## 2017-08-20 NOTE — Progress Notes (Signed)
   Subjective:    Patient ID: Jacqueline Graham, female    DOB: Feb 04, 1961, 56 y.o.   MRN: [id number]  HPI  56 year old Female With history of hypothyroidism and hyperlipidemia in for six-month recheck. No new complaints or problems. Will receive flu vaccine through employment.  Due for tetanus immunization. She will check with employee health to see if it can be received there.  She feels well with no complaints. She is to have some chronic cough but that has improved. She is on Singulair and Flonase nasal spray as well as Pepcid for GE reflux.  TSH is normal. Lipid panel and liver functions are normal.    Review of Systems     Objective:   Physical Exam  No thyromegaly. No adenopathy. Skin is warm and dry. Chest clear to auscultation. Cardiac exam regular rate and rhythm normal S1 and S2. Extremities without edema.      Assessment & Plan:  Hypothyroidism  Hyperlipidemia   GE reflux  Allergic rhinitis-treated with Singulair and Flonase nasal spray  History of glaucoma treated with Xalatan  lab work reviewed and is within normal limits on medication. Continue simvastatin and thyroid replacement.  Plan: Patient should check with employee health regarding tetanus immunization update. If they are not willing to do this we can do it here. Return in 6 months for physical exam. We'll have flu vaccine through employment.

## 2017-08-22 NOTE — Patient Instructions (Signed)
It was pleasure to see you today. Continue same medications as previously prescribed and follow up in 6 months. Flu vaccine to be done through employment. Check with employee health regarding tetanus immunization update.

## 2017-08-25 ENCOUNTER — Other Ambulatory Visit: Payer: Self-pay | Admitting: Internal Medicine

## 2017-08-25 MED FILL — MONTELUKAST SOD 10 MG TAB: 10 | 90 days supply | Qty: 90 | Fill #0

## 2017-09-14 ENCOUNTER — Other Ambulatory Visit: Payer: Self-pay | Admitting: Internal Medicine

## 2017-09-14 DIAGNOSIS — Z1231 Encounter for screening mammogram for malignant neoplasm of breast: Secondary | ICD-10-CM

## 2017-10-02 ENCOUNTER — Ambulatory Visit
Admission: RE | Admit: 2017-10-02 | Discharge: 2017-10-02 | Disposition: A | Discharge status: Home / Self Care | Payer: 59 | Source: Ambulatory Visit | Attending: Internal Medicine | Admitting: Internal Medicine

## 2017-10-02 DIAGNOSIS — Z1231 Encounter for screening mammogram for malignant neoplasm of breast: Secondary | ICD-10-CM

## 2017-10-07 DIAGNOSIS — H401131 Primary open-angle glaucoma, bilateral, mild stage: Secondary | ICD-10-CM | POA: Diagnosis not present

## 2017-10-07 DIAGNOSIS — H524 Presbyopia: Secondary | ICD-10-CM | POA: Diagnosis not present

## 2017-10-07 DIAGNOSIS — H5203 Hypermetropia, bilateral: Secondary | ICD-10-CM | POA: Diagnosis not present

## 2017-11-10 ENCOUNTER — Other Ambulatory Visit: Payer: Self-pay | Admitting: Internal Medicine

## 2017-11-10 MED FILL — SIMVASTATIN 20 MG TABLET: 20 | 90 days supply | Qty: 90 | Fill #0

## 2017-11-10 MED FILL — LEVOTHYROXINE 50 MCG TABLET: 50 | 90 days supply | Qty: 90 | Fill #0

## 2017-11-10 MED FILL — MONTELUKAST SOD 10 MG TAB: 10 | 90 days supply | Qty: 90 | Fill #1

## 2017-11-19 MED FILL — LATANOPROST 0.005% EYE DRP: 0.005 | 90 days supply | Qty: 8 | Fill #1

## 2018-02-10 ENCOUNTER — Other Ambulatory Visit: Payer: Self-pay | Admitting: Internal Medicine

## 2018-02-10 DIAGNOSIS — E039 Hypothyroidism, unspecified: Secondary | ICD-10-CM

## 2018-02-10 DIAGNOSIS — Z1321 Encounter for screening for nutritional disorder: Secondary | ICD-10-CM

## 2018-02-10 DIAGNOSIS — Z0000 Encounter for general adult medical examination without abnormal findings: Secondary | ICD-10-CM

## 2018-02-10 DIAGNOSIS — Z8249 Family history of ischemic heart disease and other diseases of the circulatory system: Secondary | ICD-10-CM

## 2018-02-10 DIAGNOSIS — E7849 Other hyperlipidemia: Secondary | ICD-10-CM

## 2018-02-17 ENCOUNTER — Other Ambulatory Visit: Payer: Self-pay | Admitting: Internal Medicine

## 2018-02-17 MED FILL — LEVOTHYROXINE 50 MCG TABLET: 50 | 90 days supply | Qty: 90 | Fill #0

## 2018-02-18 ENCOUNTER — Other Ambulatory Visit: Payer: 59 | Admitting: Internal Medicine

## 2018-02-18 DIAGNOSIS — E7849 Other hyperlipidemia: Secondary | ICD-10-CM | POA: Diagnosis not present

## 2018-02-18 DIAGNOSIS — Z0000 Encounter for general adult medical examination without abnormal findings: Secondary | ICD-10-CM | POA: Diagnosis not present

## 2018-02-18 DIAGNOSIS — Z1321 Encounter for screening for nutritional disorder: Secondary | ICD-10-CM

## 2018-02-18 DIAGNOSIS — E039 Hypothyroidism, unspecified: Secondary | ICD-10-CM

## 2018-02-18 DIAGNOSIS — Z8249 Family history of ischemic heart disease and other diseases of the circulatory system: Secondary | ICD-10-CM

## 2018-02-19 LAB — LIPID PANEL
CHOLESTEROL: 177 mg/dL (ref <200)
HDL: 71 mg/dL (ref >50)
LDL CHOLESTEROL (CALC): 93 mg/dL
Non-HDL Cholesterol (Calc): 106 mg/dL (calc) (ref <130)
Total CHOL/HDL Ratio: 2.5 (calc) (ref <5.0)
Triglycerides: 52 mg/dL (ref <150)

## 2018-02-19 LAB — COMPLETE METABOLIC PANEL WITH GFR
AG Ratio: 1.7 (calc) (ref 1.0–2.5)
ALBUMIN MSPROF: 4.4 g/dL (ref 3.6–5.1)
ALKALINE PHOSPHATASE (APISO): 67 U/L (ref 33–130)
ALT: 14 U/L (ref 6–29)
AST: 24 U/L (ref 10–35)
BUN: 15 mg/dL (ref 7–25)
CO2: 27 mmol/L (ref 20–32)
Calcium: 9.8 mg/dL (ref 8.6–10.4)
Chloride: 105 mmol/L (ref 98–110)
Creat: 0.92 mg/dL (ref 0.50–1.05)
GFR, EST AFRICAN AMERICAN: 81 mL/min/{1.73_m2} (ref >=60)
GFR, EST NON AFRICAN AMERICAN: 70 mL/min/{1.73_m2} (ref >=60)
GLOBULIN: 2.6 g/dL (ref 1.9–3.7)
Glucose, Bld: 79 mg/dL (ref 65–99)
Potassium: 4.1 mmol/L (ref 3.5–5.3)
SODIUM: 141 mmol/L (ref 135–146)
TOTAL PROTEIN: 7 g/dL (ref 6.1–8.1)
Total Bilirubin: 0.5 mg/dL (ref 0.2–1.2)

## 2018-02-19 LAB — CBC WITH DIFFERENTIAL/PLATELET
BASOS ABS: 41 {cells}/uL (ref 0–200)
Basophils Relative: 0.9 %
EOS ABS: 143 {cells}/uL (ref 15–500)
Eosinophils Relative: 3.1 %
HEMATOCRIT: 35 % (ref 35.0–45.0)
Hemoglobin: 12.1 g/dL (ref 11.7–15.5)
Lymphs Abs: 1730 cells/uL (ref 850–3900)
MCH: 30.4 pg (ref 27.0–33.0)
MCHC: 34.6 g/dL (ref 32.0–36.0)
MCV: 87.9 fL (ref 80.0–100.0)
MPV: 9.8 fL (ref 7.5–12.5)
Monocytes Relative: 8.8 %
NEUTROS PCT: 49.6 %
Neutro Abs: 2282 cells/uL (ref 1500–7800)
PLATELETS: 225 10*3/uL (ref 140–400)
RBC: 3.98 10*6/uL (ref 3.80–5.10)
RDW: 12.2 % (ref 11.0–15.0)
TOTAL LYMPHOCYTE: 37.6 %
WBC: 4.6 10*3/uL (ref 3.8–10.8)
WBCMIX: 405 {cells}/uL (ref 200–950)

## 2018-02-19 LAB — VITAMIN D 25 HYDROXY (VIT D DEFICIENCY, FRACTURES): Vit D, 25-Hydroxy: 34 ng/mL (ref 30–100)

## 2018-02-19 LAB — TSH: TSH: 1.89 mIU/L

## 2018-02-25 ENCOUNTER — Ambulatory Visit (INDEPENDENT_AMBULATORY_CARE_PROVIDER_SITE_OTHER): Payer: 59 | Admitting: Internal Medicine

## 2018-02-25 ENCOUNTER — Encounter: Payer: Self-pay | Admitting: Internal Medicine

## 2018-02-25 VITALS — BP 102/68 | HR 68 | Ht 60.0 in | Wt 109.0 lb

## 2018-02-25 DIAGNOSIS — E7849 Other hyperlipidemia: Secondary | ICD-10-CM | POA: Diagnosis not present

## 2018-02-25 DIAGNOSIS — K219 Gastro-esophageal reflux disease without esophagitis: Secondary | ICD-10-CM

## 2018-02-25 DIAGNOSIS — Z8249 Family history of ischemic heart disease and other diseases of the circulatory system: Secondary | ICD-10-CM | POA: Diagnosis not present

## 2018-02-25 DIAGNOSIS — Z8669 Personal history of other diseases of the nervous system and sense organs: Secondary | ICD-10-CM

## 2018-02-25 DIAGNOSIS — E039 Hypothyroidism, unspecified: Secondary | ICD-10-CM

## 2018-02-25 DIAGNOSIS — Z0000 Encounter for general adult medical examination without abnormal findings: Secondary | ICD-10-CM | POA: Diagnosis not present

## 2018-02-25 DIAGNOSIS — J309 Allergic rhinitis, unspecified: Secondary | ICD-10-CM | POA: Diagnosis not present

## 2018-02-25 LAB — POCT URINALYSIS DIPSTICK
Appearance: NORMAL
BILIRUBIN UA: NEGATIVE
GLUCOSE UA: NEGATIVE
KETONES UA: NEGATIVE
Leukocytes, UA: NEGATIVE
Nitrite, UA: NEGATIVE
ODOR: NORMAL
PH UA: 6 (ref 5.0–8.0)
Protein, UA: NEGATIVE
Spec Grav, UA: 1.015 (ref 1.010–1.025)
UROBILINOGEN UA: 0.2 U/dL

## 2018-03-16 ENCOUNTER — Other Ambulatory Visit: Payer: Self-pay | Admitting: Internal Medicine

## 2018-03-16 MED FILL — MONTELUKAST SOD 10 MG TAB: 10 | 90 days supply | Qty: 90 | Fill #0

## 2018-03-16 MED FILL — SIMVASTATIN 20 MG TABLET: 20 | 90 days supply | Qty: 90 | Fill #1

## 2018-03-22 NOTE — Patient Instructions (Signed)
It was a pleasure to see you today.  Lab work reviewed and is within normal limits.  Continue same medications and return in 1 year or as needed.

## 2018-03-22 NOTE — Progress Notes (Signed)
Subjective:    Patient ID: Jacqueline Graham, female    DOB: 1961-04-13, 57 y.o.   MRN: [id number]  HPI 57 year old Female in today for health maintenance exam and evaluation of medical issues.  History of hypothyroidism and hyperlipidemia.  GE reflux treated with Nexium.  History of allergic rhinitis treated with Zyrtec and Flonase.  She had a cough at one point but that improved after trying Singulair.  History of migraine headaches.  Has glaucoma both eyes.  Past medical history: Left breast fibroadenoma removed in the 1980s.  [place] Spotted Fever 1971.  History of palpitations in 2012.  Cardiac murmur was discovered at the time.  She had mildly thickened leaflets of the mitral valve on 2D echo.  Aortic valve was normal.  Uses Xalatan eyedrops for glaucoma  Social history: Non-smoker.  Social alcohol consumption.  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  No children.  Has a 2-year college degree.  Family history: Father had a stroke.  He has a pacemaker and history of third-degree AV block and syncope.  Mother with history of dementia, kidney stones, status post acute renal  failure after receiving IV contrast dye.  Mother has hyperlipidemia, hypertension, hypothyroidism, diabetes, coronary artery disease status post stent placement.  Paternal grandmother with history of colon cancer.  Maternal grandmother with history of lymphoma.  3 brothers and one sister.  One brother has diabetes hypertension and kidney stones.    Review of Systems  Constitutional: Negative.   All other systems reviewed and are negative.      Objective:   Physical Exam  Constitutional: She appears well-developed and well-nourished.  HENT:  Head: Normocephalic and atraumatic.  Right Ear: External ear normal.  Left Ear: External ear normal.  Mouth/Throat: Oropharynx is clear and moist.  Eyes: Pupils are equal, round, and reactive to light. Conjunctivae are normal. Right eye exhibits  no discharge. Left eye exhibits no discharge.  Neck: Normal range of motion. No JVD present. No tracheal deviation present. No thyromegaly present.  Cardiovascular: Normal rate and regular rhythm. Exam reveals no friction rub.  Murmur heard. 1/6 systolic ejection murmur  Pulmonary/Chest: Effort normal and breath sounds normal. No stridor. No respiratory distress. She has no wheezes. She has no rales.  Abdominal: Soft. Bowel sounds are normal. She exhibits no distension and no mass. There is no tenderness. There is no rebound and no guarding. No hernia.  Genitourinary:  Genitourinary Comments: Pap taken in 2018.  Bimanual normal  Musculoskeletal: She exhibits no edema or deformity.  Lymphadenopathy:    She has no cervical adenopathy.  Neurological: She is alert. She displays normal reflexes. No cranial nerve deficit or sensory deficit. She exhibits normal muscle tone. Coordination normal.  Skin: Skin is warm and dry. No rash noted. No erythema.  Psychiatric: She has a normal mood and affect. Her behavior is normal. Judgment and thought content normal.          Assessment & Plan:  Hypothyroidism-stable on thyroid replacement therapy.  Continue same dose of thyroid replacement  Hyperlipidemia-normal lipid panel on statin therapy  History of glaucoma  History of migraine headaches  Family history of heart disease  History of cardiac murmur-mildly thickened leaflets of mitral valve on 2D echocardiogram in 2012  Allergic rhinitis  History of benign microscopic hematuria  Plan: I am pleased with her lab values.  Continue same medications and follow-up in  1 year since her medical issues are stable.

## 2018-03-26 MED FILL — LATANOPROST 0.005% EYE DRP: 0.005 | 90 days supply | Qty: 8 | Fill #2

## 2018-04-23 DIAGNOSIS — H401131 Primary open-angle glaucoma, bilateral, mild stage: Secondary | ICD-10-CM | POA: Diagnosis not present

## 2018-05-26 ENCOUNTER — Other Ambulatory Visit: Payer: Self-pay | Admitting: Internal Medicine

## 2018-05-26 MED FILL — LEVOTHYROXINE 50 MCG TABLET: 50 | 90 days supply | Qty: 90 | Fill #0

## 2018-06-29 MED FILL — SIMVASTATIN 20 MG TABLET: 20 | 90 days supply | Qty: 90 | Fill #2

## 2018-06-29 MED FILL — MONTELUKAST SOD 10 MG TAB: 10 | 90 days supply | Qty: 90 | Fill #1

## 2018-07-23 ENCOUNTER — Telehealth: Payer: Self-pay

## 2018-07-23 ENCOUNTER — Ambulatory Visit (INDEPENDENT_AMBULATORY_CARE_PROVIDER_SITE_OTHER): Payer: 59 | Admitting: Internal Medicine

## 2018-07-23 ENCOUNTER — Encounter: Payer: Self-pay | Admitting: Internal Medicine

## 2018-07-23 VITALS — BP 120/80 | HR 78 | Temp 98.2°F | Ht 60.0 in | Wt 111.0 lb

## 2018-07-23 DIAGNOSIS — R319 Hematuria, unspecified: Secondary | ICD-10-CM

## 2018-07-23 DIAGNOSIS — R8290 Unspecified abnormal findings in urine: Secondary | ICD-10-CM | POA: Diagnosis not present

## 2018-07-23 DIAGNOSIS — N3091 Cystitis, unspecified with hematuria: Secondary | ICD-10-CM | POA: Diagnosis not present

## 2018-07-23 DIAGNOSIS — R300 Dysuria: Secondary | ICD-10-CM | POA: Diagnosis not present

## 2018-07-23 LAB — POCT URINALYSIS DIPSTICK
Bilirubin, UA: POSITIVE
Glucose, UA: NEGATIVE
Ketones, UA: NEGATIVE
NITRITE UA: NEGATIVE
Protein, UA: POSITIVE — AB
Spec Grav, UA: 1.01 (ref 1.010–1.025)
Urobilinogen, UA: 0.2 E.U./dL
pH, UA: 6 (ref 5.0–8.0)

## 2018-07-23 MED ORDER — FLUCONAZOLE 150 MG PO TABS: 150.0000 mg | ORAL_TABLET | Freq: Once | ORAL | 0 refills | Status: AC

## 2018-07-23 MED ORDER — CIPROFLOXACIN HCL 500 MG PO TABS: 500.0000 mg | ORAL_TABLET | Freq: Two times a day (BID) | ORAL | 0 refills | Status: DC

## 2018-07-23 MED FILL — FLUCONAZOLE 150 MG TABS: 150 | 1 days supply | Qty: 1 | Fill #0

## 2018-07-23 MED FILL — CIPROFLOXACIN HCL 500 MG TA: 500 | 10 days supply | Qty: 20 | Fill #0

## 2018-07-23 NOTE — Progress Notes (Signed)
   Subjective:    Patient ID: Jacqueline Graham, female    DOB: 04-May-1961, 57 y.o.   MRN: [id number]  HPI She called today and asked to be seen on urgent basis complaining of UTI symptoms and hematuria.  By late morning she was beginning to experience some discomfort with urination and frequency.  No fever or chills.  No back pain.      Review of Systems     Objective:   Physical Exam  Inspected her vagina to make sure this was not vaginal bleeding.  No vaginal bleeding seen.  Urine dipstick showed large amount of blood.  Urine appearance was very bloody and bright red.      Assessment & Plan:  Acute hemorrhagic cystitis  Plan: Cipro 500 mg twice daily for 10 days.  May take Azo-Standard over-the-counter.  Culture sent.

## 2018-07-23 NOTE — Telephone Encounter (Signed)
Patient called started having symptoms of a UTI and she noticed blood in her urine, she would like to know if you can work her in?

## 2018-07-23 NOTE — Patient Instructions (Signed)
Cipro 500 mg twice daily for 10 days.  Azo-Standard over-the-counter.  Drink plenty of fluids.

## 2018-07-25 LAB — URINALYSIS, MICROSCOPIC ONLY
BACTERIA UA: NONE SEEN /HPF
Hyaline Cast: NONE SEEN /LPF
SQUAMOUS EPITHELIAL / LPF: NONE SEEN /HPF (ref <=5)

## 2018-07-25 LAB — URINE CULTURE
MICRO NUMBER:: 91041396
SPECIMEN QUALITY:: ADEQUATE

## 2018-07-30 ENCOUNTER — Encounter: Payer: Self-pay | Admitting: Internal Medicine

## 2018-07-30 ENCOUNTER — Ambulatory Visit (INDEPENDENT_AMBULATORY_CARE_PROVIDER_SITE_OTHER): Payer: 59 | Admitting: Internal Medicine

## 2018-07-30 VITALS — BP 110/70 | HR 70 | Temp 98.1°F | Ht 60.0 in | Wt 111.0 lb

## 2018-07-30 DIAGNOSIS — R8290 Unspecified abnormal findings in urine: Secondary | ICD-10-CM | POA: Diagnosis not present

## 2018-07-30 DIAGNOSIS — N390 Urinary tract infection, site not specified: Secondary | ICD-10-CM | POA: Diagnosis not present

## 2018-07-30 DIAGNOSIS — B9620 Unspecified Escherichia coli [E. coli] as the cause of diseases classified elsewhere: Secondary | ICD-10-CM | POA: Diagnosis not present

## 2018-07-30 LAB — POCT URINALYSIS DIPSTICK
Appearance: NORMAL
Bilirubin, UA: NEGATIVE
Glucose, UA: NEGATIVE
Ketones, UA: NEGATIVE
LEUKOCYTES UA: NEGATIVE
NITRITE UA: NEGATIVE
Odor: NORMAL
PH UA: 6 (ref 5.0–8.0)
PROTEIN UA: NEGATIVE
SPEC GRAV UA: 1.015 (ref 1.010–1.025)
UROBILINOGEN UA: 0.2 U/dL

## 2018-08-17 ENCOUNTER — Encounter: Payer: Self-pay | Admitting: Internal Medicine

## 2018-08-17 NOTE — Progress Notes (Signed)
   Subjective:    Patient ID: Jacqueline Graham, female    DOB: September 18, 1961, 57 y.o.   MRN: [id number]  HPI  E.coli UTI resolved.     Review of Systems     Objective:   Physical Exam        Assessment & Plan:

## 2018-08-17 NOTE — Patient Instructions (Addendum)
UTI S/P treatment has  resolved. Now with normal urine dipstick. Seen by CMA

## 2018-08-23 MED FILL — LATANOPROST 0.005% EYE DRP: 0.005 | 90 days supply | Qty: 8 | Fill #0

## 2018-08-31 MED FILL — LEVOTHYROXINE 50 MCG TABLET: 50 | 90 days supply | Qty: 90 | Fill #1

## 2018-10-13 ENCOUNTER — Other Ambulatory Visit: Payer: Self-pay | Admitting: Internal Medicine

## 2018-10-13 DIAGNOSIS — Z1231 Encounter for screening mammogram for malignant neoplasm of breast: Secondary | ICD-10-CM

## 2018-10-14 ENCOUNTER — Other Ambulatory Visit: Payer: Self-pay | Admitting: Internal Medicine

## 2018-10-14 MED FILL — MONTELUKAST SOD 10 MG TAB: 10 | 90 days supply | Qty: 90 | Fill #0

## 2018-10-14 MED FILL — SIMVASTATIN 20 MG TABLET: 20 | 90 days supply | Qty: 90 | Fill #3

## 2018-10-28 DIAGNOSIS — H5203 Hypermetropia, bilateral: Secondary | ICD-10-CM | POA: Diagnosis not present

## 2018-10-28 DIAGNOSIS — H524 Presbyopia: Secondary | ICD-10-CM | POA: Diagnosis not present

## 2018-10-28 DIAGNOSIS — H401131 Primary open-angle glaucoma, bilateral, mild stage: Secondary | ICD-10-CM | POA: Diagnosis not present

## 2018-10-28 DIAGNOSIS — H2513 Age-related nuclear cataract, bilateral: Secondary | ICD-10-CM | POA: Diagnosis not present

## 2018-11-26 ENCOUNTER — Ambulatory Visit
Admission: RE | Admit: 2018-11-26 | Discharge: 2018-11-26 | Disposition: A | Discharge status: Home / Self Care | Payer: 59 | Source: Ambulatory Visit | Attending: Internal Medicine | Admitting: Internal Medicine

## 2018-11-26 DIAGNOSIS — Z1231 Encounter for screening mammogram for malignant neoplasm of breast: Secondary | ICD-10-CM | POA: Diagnosis not present

## 2018-12-14 MED FILL — LEVOTHYROXINE 50 MCG TABLET: 50 | 90 days supply | Qty: 90 | Fill #2

## 2018-12-14 MED FILL — LATANOPROST 0.005% EYE DRP: 0.005 | 90 days supply | Qty: 8 | Fill #1

## 2019-01-24 ENCOUNTER — Other Ambulatory Visit: Payer: Self-pay | Admitting: Internal Medicine

## 2019-01-24 MED FILL — SIMVASTATIN 20 MG TABLET: 20 | 90 days supply | Qty: 90 | Fill #0

## 2019-01-24 MED FILL — MONTELUKAST SOD 10 MG TAB: 10 | 90 days supply | Qty: 90 | Fill #1

## 2019-02-25 ENCOUNTER — Other Ambulatory Visit: Payer: Self-pay

## 2019-02-25 ENCOUNTER — Other Ambulatory Visit: Payer: 59 | Admitting: Internal Medicine

## 2019-02-25 VITALS — Temp 98.3°F

## 2019-02-25 DIAGNOSIS — E7849 Other hyperlipidemia: Secondary | ICD-10-CM | POA: Diagnosis not present

## 2019-02-25 DIAGNOSIS — Z0000 Encounter for general adult medical examination without abnormal findings: Secondary | ICD-10-CM

## 2019-02-25 DIAGNOSIS — K219 Gastro-esophageal reflux disease without esophagitis: Secondary | ICD-10-CM

## 2019-02-25 DIAGNOSIS — Z8249 Family history of ischemic heart disease and other diseases of the circulatory system: Secondary | ICD-10-CM

## 2019-02-25 DIAGNOSIS — E039 Hypothyroidism, unspecified: Secondary | ICD-10-CM

## 2019-02-25 DIAGNOSIS — J302 Other seasonal allergic rhinitis: Secondary | ICD-10-CM

## 2019-02-26 LAB — LIPID PANEL
Cholesterol: 162 mg/dL (ref <200)
HDL: 72 mg/dL (ref >=50)
LDL Cholesterol (Calc): 77 mg/dL (calc)
Non-HDL Cholesterol (Calc): 90 mg/dL (calc) (ref <130)
Total CHOL/HDL Ratio: 2.3 (calc) (ref <5.0)
Triglycerides: 54 mg/dL (ref <150)

## 2019-02-26 LAB — CBC WITH DIFFERENTIAL/PLATELET
Absolute Monocytes: 374 cells/uL (ref 200–950)
Basophils Absolute: 31 cells/uL (ref 0–200)
Basophils Relative: 0.7 %
Eosinophils Absolute: 110 cells/uL (ref 15–500)
Eosinophils Relative: 2.5 %
HCT: 36.1 % (ref 35.0–45.0)
Hemoglobin: 12.5 g/dL (ref 11.7–15.5)
Lymphs Abs: 1628 cells/uL (ref 850–3900)
MCH: 31.4 pg (ref 27.0–33.0)
MCHC: 34.6 g/dL (ref 32.0–36.0)
MCV: 90.7 fL (ref 80.0–100.0)
MPV: 10.3 fL (ref 7.5–12.5)
Monocytes Relative: 8.5 %
Neutro Abs: 2257 cells/uL (ref 1500–7800)
Neutrophils Relative %: 51.3 %
Platelets: 223 10*3/uL (ref 140–400)
RBC: 3.98 10*6/uL (ref 3.80–5.10)
RDW: 12.4 % (ref 11.0–15.0)
Total Lymphocyte: 37 %
WBC: 4.4 10*3/uL (ref 3.8–10.8)

## 2019-02-26 LAB — TSH: TSH: 2.66 mIU/L (ref 0.40–4.50)

## 2019-02-26 LAB — COMPLETE METABOLIC PANEL WITH GFR
AG Ratio: 2 (calc) (ref 1.0–2.5)
ALT: 19 U/L (ref 6–29)
AST: 25 U/L (ref 10–35)
Albumin: 4.5 g/dL (ref 3.6–5.1)
Alkaline phosphatase (APISO): 58 U/L (ref 37–153)
BUN: 14 mg/dL (ref 7–25)
CO2: 28 mmol/L (ref 20–32)
Calcium: 9.7 mg/dL (ref 8.6–10.4)
Chloride: 103 mmol/L (ref 98–110)
Creat: 1.02 mg/dL (ref 0.50–1.05)
GFR, Est African American: 71 mL/min/{1.73_m2} (ref >=60)
GFR, Est Non African American: 61 mL/min/{1.73_m2} (ref >=60)
Globulin: 2.2 g/dL (calc) (ref 1.9–3.7)
Glucose, Bld: 81 mg/dL (ref 65–99)
Potassium: 4.5 mmol/L (ref 3.5–5.3)
Sodium: 139 mmol/L (ref 135–146)
Total Bilirubin: 0.5 mg/dL (ref 0.2–1.2)
Total Protein: 6.7 g/dL (ref 6.1–8.1)

## 2019-02-26 LAB — VITAMIN D 25 HYDROXY (VIT D DEFICIENCY, FRACTURES): Vit D, 25-Hydroxy: 34 ng/mL (ref 30–100)

## 2019-03-01 ENCOUNTER — Encounter: Payer: 59 | Admitting: Internal Medicine

## 2019-03-15 ENCOUNTER — Other Ambulatory Visit: Payer: Self-pay | Admitting: Internal Medicine

## 2019-03-15 MED FILL — LATANOPROST 0.005% EYE DRP: 0.005 | 90 days supply | Qty: 8 | Fill #0

## 2019-03-15 MED FILL — LEVOTHYROXINE 50 MCG TABLET: 50 | 90 days supply | Qty: 90 | Fill #0

## 2019-05-02 ENCOUNTER — Other Ambulatory Visit: Payer: Self-pay | Admitting: Internal Medicine

## 2019-05-02 MED FILL — SIMVASTATIN 20 MG TABLET: 20 | 90 days supply | Qty: 90 | Fill #1

## 2019-05-02 MED FILL — MONTELUKAST SOD 10 MG TAB: 10 | 90 days supply | Qty: 90 | Fill #0

## 2019-05-03 ENCOUNTER — Ambulatory Visit (INDEPENDENT_AMBULATORY_CARE_PROVIDER_SITE_OTHER): Payer: 59 | Admitting: Internal Medicine

## 2019-05-03 ENCOUNTER — Other Ambulatory Visit: Payer: Self-pay

## 2019-05-03 ENCOUNTER — Encounter: Payer: Self-pay | Admitting: Internal Medicine

## 2019-05-03 VITALS — BP 100/70 | HR 69 | Ht 60.0 in | Wt 110.0 lb

## 2019-05-03 DIAGNOSIS — H401131 Primary open-angle glaucoma, bilateral, mild stage: Secondary | ICD-10-CM | POA: Diagnosis not present

## 2019-05-03 DIAGNOSIS — Z8249 Family history of ischemic heart disease and other diseases of the circulatory system: Secondary | ICD-10-CM | POA: Diagnosis not present

## 2019-05-03 DIAGNOSIS — R3129 Other microscopic hematuria: Secondary | ICD-10-CM

## 2019-05-03 DIAGNOSIS — Z0000 Encounter for general adult medical examination without abnormal findings: Secondary | ICD-10-CM | POA: Diagnosis not present

## 2019-05-03 DIAGNOSIS — Z8669 Personal history of other diseases of the nervous system and sense organs: Secondary | ICD-10-CM

## 2019-05-03 DIAGNOSIS — E7849 Other hyperlipidemia: Secondary | ICD-10-CM

## 2019-05-03 DIAGNOSIS — E039 Hypothyroidism, unspecified: Secondary | ICD-10-CM | POA: Diagnosis not present

## 2019-05-03 LAB — POCT URINALYSIS DIPSTICK
Appearance: NEGATIVE
Bilirubin, UA: NEGATIVE
Glucose, UA: NEGATIVE
Ketones, UA: NEGATIVE
Leukocytes, UA: NEGATIVE
Nitrite, UA: NEGATIVE
Odor: NEGATIVE
Protein, UA: NEGATIVE
Spec Grav, UA: 1.01 (ref 1.010–1.025)
Urobilinogen, UA: 0.2 E.U./dL
pH, UA: 7.5 (ref 5.0–8.0)

## 2019-05-03 NOTE — Progress Notes (Signed)
   Subjective:    Patient ID: Jacqueline Graham, female    DOB: 1961-06-29, 58 y.o.   MRN: [id number]  HPI 58 year old Female for health maintenance exam and evaluation of medical issues.  History of hypothyroidism and hyperlipidemia.  She has GE reflux treated with Nexium.  History of allergic rhinitis treated with Flonase and Zyrtec.  She had a cough at one point but that improved after taking Singulair.  History of migraine headaches.  Has glaucoma both eyes.  Past medical history: Left breast fibroadenoma removed in the 1980s.  [place] Spotted Fever 1971.  Uses Xalatan eyedrops for glaucoma.  Social history: Non-smoker.  Social alcohol consumption.  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  No children.  2-year college degree.  Family history: Father had a stroke.  He had a pacemaker and third-degree AV block with syncope.  Mother with history of dementia, kidney stones, status post acute renal failure after receiving IV contrast dye.  Mother with history of hyperlipidemia, hypertension and hypothyroidism.  She also has coronary artery disease status post stent placement.  Paternal grandmother with history of colon cancer.  Maternal grandmother with history of lymphoma.  3 brothers and 1 sister.  One brother has diabetes, hypertension and kidney stones.       Review of Systems no new complaints.  General health is excellent.     Objective:   Physical Exam Vital signs: Blood pressure 100/70, pulse 69, weight 110 pounds, height 5 feet 0 inches.  BMI 21.48  Skin is warm and dry.  Nodes none.  Neck is supple without JVD thyromegaly or carotid bruits.  Chest clear to auscultation without rales or wheezing.  Cardiac exam regular rate and rhythm 1/6 systolic ejection murmur present.  Abdomen soft nondistended without hepatosplenomegaly masses or tenderness.  Pap taken in 2018.  Bimanual normal.  No deformity of the lower extremities.  Neurological exam she is alert and  oriented x3 without focal deficits.  Psychiatric.  She has normal mood and affect.  Behavior , judgment and thought process normal.       Assessment & Plan:  Hypothyroidism-stable on thyroid replacement therapy.  Continue same dose of thyroid replacement and return in 1 year  Hyperlipidemia-normal lipid panel on statin therapy.  Return in 1 year  History of glaucoma treated with Xalatan eyedrops by ophthalmology  History of migraine headaches  Family history of heart disease  History of cardiac murmur-mildly thickened leaflets on mitral valve via 2D echocardiogram in 2012.  Allergic rhinitis-cough resolved with Singulair  History of benign microscopic hematuria  Plan: I am very pleased with her lab work.  Continue same medications and follow-up in 1 year.

## 2019-05-21 ENCOUNTER — Encounter: Payer: Self-pay | Admitting: Internal Medicine

## 2019-05-21 NOTE — Patient Instructions (Signed)
It was a pleasure to see you today.  Your lab work is completely normal.  Continue same medications and follow-up in 1 year

## 2019-07-01 MED FILL — LEVOTHYROXINE 50 MCG TABLET: 50 | 90 days supply | Qty: 90 | Fill #0

## 2019-08-12 MED FILL — MONTELUKAST SOD 10 MG TAB: 10 | 90 days supply | Qty: 90 | Fill #1

## 2019-08-12 MED FILL — SIMVASTATIN 20 MG TABLET: 20 | 90 days supply | Qty: 90 | Fill #2

## 2019-08-15 MED FILL — LATANOPROST 0.005% EYE DRP: 0.005 | 90 days supply | Qty: 8 | Fill #0

## 2019-09-01 ENCOUNTER — Encounter: Payer: Self-pay | Admitting: Physician Assistant

## 2019-09-01 ENCOUNTER — Ambulatory Visit
Admission: EM | Admit: 2019-09-01 | Discharge: 2019-09-01 | Disposition: A | Discharge status: Home / Self Care | Payer: 59 | Attending: Physician Assistant | Admitting: Physician Assistant

## 2019-09-01 ENCOUNTER — Other Ambulatory Visit: Payer: Self-pay

## 2019-09-01 DIAGNOSIS — S61215A Laceration without foreign body of left ring finger without damage to nail, initial encounter: Secondary | ICD-10-CM | POA: Diagnosis not present

## 2019-09-01 DIAGNOSIS — Z23 Encounter for immunization: Secondary | ICD-10-CM | POA: Diagnosis not present

## 2019-09-01 DIAGNOSIS — S61311A Laceration without foreign body of left index finger with damage to nail, initial encounter: Secondary | ICD-10-CM

## 2019-09-01 DIAGNOSIS — W293XXA Contact with powered garden and outdoor hand tools and machinery, initial encounter: Secondary | ICD-10-CM

## 2019-09-01 MED ORDER — TETANUS-DIPHTH-ACELL PERTUSSIS 5-2.5-18.5 LF-MCG/0.5 IM SUSP
0.5000 mL | Freq: Once | INTRAMUSCULAR | Status: AC
  Administered 2019-09-01: 0.5 mL via INTRAMUSCULAR

## 2019-09-01 NOTE — ED Notes (Signed)
Patient able to ambulate independently  

## 2019-09-01 NOTE — Discharge Instructions (Addendum)
Tetanus updated. 8 sutures placed. You can remove current dressing in 24 hours. Keep wound clean and dry. You can clean gently with soap and water. Do not soak area in water. Monitor for spreading redness, increased warmth, increased swelling, fever, follow up for reevaluation needed. Otherwise follow up in 7 days for suture removal.

## 2019-09-01 NOTE — ED Triage Notes (Addendum)
Pt presents to [facility] for assessment of laceration to index finger and ring of the left hand with an electric hedger.  Last tetanus possible 2012 or 2010.

## 2019-09-01 NOTE — ED Provider Notes (Signed)
[facility]    CSN: [id number] Arrival date & time: 09/01/19  0908      History   Chief Complaint Chief Complaint  Patient presents with  . Laceration    HPI Jacqueline Graham is a 58 y.o. female.   58 year old female comes in for left finger lacerations that occurred yesterday with an Careers information officer. Lacerations occurred <18 hours. Patient with 3 parallel linear lacerations to the left index finger, and 1 skin avulsion to the left ring finger.  Bleeding controlled.  Patient irrigated wound after incident, dressed with Neosporin.  Denies numbness, tingling, decrease in range of motion.  Denies spreading erythema, warmth, purulent drainage.  Denies fever, chills, body aches.  Last tetanus 8 to 10 years ago.     Past Medical History:  Diagnosis Date  . Hyperlipidemia     Patient Active Problem List   Diagnosis Date Noted  . Allergic rhinitis 02/21/2016  . Family history of heart disease 02/21/2016  . History of migraine headaches 02/21/2016  . GERD (gastroesophageal reflux disease) 08/19/2015  . Palpitations 02/21/2013  . Hypothyroidism 08/20/2011  . Glaucoma 08/20/2011  . Hyperlipidemia 08/20/2011  . Fibrocystic breast disease 08/20/2011  . Benign microscopic hematuria 08/20/2011    Past Surgical History:  Procedure Laterality Date  . BREAST SURGERY     fibroadenoma  . GUM SURGERY      OB History   No obstetric history on file.      Home Medications    Prior to Admission medications   Medication Sig Start Date End Date Taking? Authorizing Provider  calcium carbonate (OS-CAL) 600 MG TABS Take 600 mg by mouth 2 (two) times daily with a meal.      [provider]  fluticasone (FLONASE) 50 MCG/ACT nasal spray Place 1 spray into both nostrils daily.    [provider]  latanoprost (XALATAN) 0.005 % ophthalmic solution 1 drop at bedtime.      [provider]  levothyroxine (SYNTHROID) 50 MCG tablet TAKE 1 TABLET BY MOUTH  DAILY. 03/15/19   Elby Showers, MD  montelukast (SINGULAIR) 10 MG tablet TAKE 1 TABLET (10 MG TOTAL) BY MOUTH AT BEDTIME. 05/02/19   Baxley, Cresenciano Lick, MD  Pseudoephedrine HCl (SUDAFED 12 HOUR PO) Take by mouth.    [provider]  simvastatin (ZOCOR) 20 MG tablet TAKE 1 TABLET BY MOUTH ONCE DAILY 01/24/19   Elby Showers, MD    Family History Family History  Problem Relation Age of Onset  . Diabetes Mother   . Hypertension Mother   . Heart disease Mother   . Hyperlipidemia Mother   . Heart disease Father   . Stroke Father     Social History Social History   Tobacco Use  . Smoking status: Never Smoker  . Smokeless tobacco: Never Used  Substance Use Topics  . Alcohol use: Yes    Alcohol/week: 0.0 standard drinks    Comment: social  . Drug use: No     Allergies   Amoxicillin and Sulfa antibiotics   Review of Systems Review of Systems  Reason unable to perform ROS: See HPI as above.     Physical Exam Triage Vital Signs ED Triage Vitals  Enc Vitals Group     BP 09/01/19 0912 (!) 101/56     Pulse Rate 09/01/19 0912 89     Resp 09/01/19 0912 18     Temp 09/01/19 0912 98.1 F (36.7 C)     Temp Source 09/01/19  0912 Oral     SpO2 09/01/19 0912 98 %     Weight --      Height --      Head Circumference --      Peak Flow --      Pain Score 09/01/19 0913 0     Pain Loc --      Pain Edu? --      Excl. in [place]? --    No data found.  Updated Vital Signs BP (!) 101/56 (BP Location: Left Arm)   Pulse 89   Temp 98.1 F (36.7 C) (Oral)   Resp 18   SpO2 98%   Physical Exam Constitutional:      General: She is not in acute distress.    Appearance: She is well-developed. She is not diaphoretic.  HENT:     Head: Normocephalic and atraumatic.  Eyes:     Conjunctiva/sclera: Conjunctivae normal.     Pupils: Pupils are equal, round, and reactive to light.  Pulmonary:     Effort: Pulmonary effort is normal. No respiratory distress.  Musculoskeletal:      Comments: 3 parallel linear lacerations to the distal left index finger, bleeding controlled.  1 laceration with nail damage.  Full range of motion of finger, NVI.   Skin avulsion to left ring finger, bleeding controlled.  Full range of motion of finger.  MVI.  Skin:    General: Skin is warm and dry.  Neurological:     Mental Status: She is alert and oriented to person, place, and time.    UC Treatments / Results  Labs (all labs ordered are listed, but only abnormal results are displayed) Labs Reviewed - No data to display  EKG   Radiology No results found.  Procedures Laceration Repair  Date/Time: 09/01/2019 1:29 PM Performed by: Ok Edwards, PA-C Authorized by: Ok Edwards, PA-C   Consent:    Consent obtained:  Verbal   Consent given by:  Patient   Risks discussed:  Infection, pain, poor cosmetic result, poor wound healing, need for additional repair and nerve damage   Alternatives discussed:  No treatment and referral Anesthesia (see MAR for exact dosages):    Anesthesia method:  Nerve block   Block needle gauge:  27 G   Block anesthetic:  Lidocaine 2% w/o epi   Block injection procedure:  Anatomic landmarks identified, introduced needle, incremental injection, anatomic landmarks palpated and negative aspiration for blood   Block outcome:  Anesthesia achieved Laceration details:    Location:  Finger   Finger location:  L index finger   Wound length (cm): 1.5cm; 1.5cm; 1cm.   Laceration depth: 2; 2; 1. Repair type:    Repair type:  Intermediate Pre-procedure details:    Preparation:  Patient was prepped and draped in usual sterile fashion Exploration:    Hemostasis achieved with:  Direct pressure   Wound exploration: wound explored through full range of motion and entire depth of wound probed and visualized     Contaminated: no   Treatment:    Area cleansed with:  Hibiclens   Amount of cleaning:  Extensive   Irrigation solution:  Sterile saline   Irrigation method:   Pressure wash and tap   Visualized foreign bodies/material removed: no   Skin repair:    Repair method:  Sutures   Suture size:  5-0   Suture material:  Prolene   Suture technique:  Simple interrupted   Number of sutures: 3; 3; 2. Approximation:  Approximation:  Close Post-procedure details:    Dressing:  Antibiotic ointment and bulky dressing   Patient tolerance of procedure:  Tolerated well, no immediate complications  Laceration Repair  Date/Time: 09/01/2019 2:14 PM Performed by: Ok Edwards, PA-C Authorized by: Ok Edwards, PA-C   Consent:    Consent obtained:  Verbal   Consent given by:  Patient   Risks discussed:  Infection, pain, poor cosmetic result and poor wound healing   Alternatives discussed:  No treatment Anesthesia (see MAR for exact dosages):    Anesthesia method:  None Laceration details:    Location:  Finger   Finger location:  L ring finger   Wound length (cm): 1cm total (V shaped skin avulsion)   Laceration depth: 1. Repair type:    Repair type:  Simple Pre-procedure details:    Preparation:  Patient was prepped and draped in usual sterile fashion Exploration:    Hemostasis obtained with: no bleeding.   Wound exploration: wound explored through full range of motion   Treatment:    Area cleansed with:  Hibiclens   Amount of cleaning:  Standard   Irrigation solution:  Sterile saline   Irrigation method:  Tap   Visualized foreign bodies/material removed: no   Skin repair:    Repair method:  Steri-Strips   Number of Steri-Strips:  1 Approximation:    Approximation:  Close Post-procedure details:    Dressing:  Open (no dressing)   Patient tolerance of procedure:  Tolerated well, no immediate complications   (including critical care time)  Medications Ordered in UC Medications  Tdap (BOOSTRIX) injection 0.5 mL (0.5 mLs Intramuscular Given 09/01/19 1053)    Initial Impression / Assessment and Plan / UC Course  I have reviewed the triage vital  signs and the nursing notes.  Pertinent labs & imaging results that were available during my care of the patient were reviewed by me and considered in my medical decision making (see chart for details).     Discussed with patient, nailbed laceration to the left index finger, however, able to approximate without nail removal. Discussed may have nail deformity in the future. Patient expresses understanding.  Tetanus updated. Patient tolerated procedure well. 8 sutures placed. 1 steristrip. Wound care instructions given. Return precautions given. Otherwise, follow up in 7 days for suture removal. Patient expresses understanding and agrees to plan.   Final Clinical Impressions(s) / UC Diagnoses   Final diagnoses:  Laceration of left index finger without foreign body with damage to nail, initial encounter  Laceration of left ring finger without foreign body without damage to nail, initial encounter    ED Prescriptions    None     PDMP not reviewed this encounter.   Ok Edwards, PA-C 09/01/19 1417

## 2019-10-05 MED FILL — LEVOTHYROXINE 50 MCG TABLET: 50 | 90 days supply | Qty: 90 | Fill #1

## 2019-11-03 ENCOUNTER — Telehealth: Payer: Self-pay | Admitting: Internal Medicine

## 2019-11-03 NOTE — Telephone Encounter (Signed)
LVM to CB, we receive request from [employer] for Medical records for the last five years for workman's comp.

## 2019-11-07 NOTE — Telephone Encounter (Signed)
Jacqueline Graham called back to say that she had been denied workman's comp, Marcelino Duster is not necessary to send them medical records. She will call back if that changes.

## 2019-11-15 DIAGNOSIS — H524 Presbyopia: Secondary | ICD-10-CM | POA: Diagnosis not present

## 2019-11-15 DIAGNOSIS — H5203 Hypermetropia, bilateral: Secondary | ICD-10-CM | POA: Diagnosis not present

## 2019-11-15 DIAGNOSIS — H401131 Primary open-angle glaucoma, bilateral, mild stage: Secondary | ICD-10-CM | POA: Diagnosis not present

## 2019-11-15 DIAGNOSIS — H2513 Age-related nuclear cataract, bilateral: Secondary | ICD-10-CM | POA: Diagnosis not present

## 2019-11-16 ENCOUNTER — Other Ambulatory Visit: Payer: Self-pay | Admitting: Internal Medicine

## 2019-11-16 MED FILL — MONTELUKAST SOD 10 MG TAB: 10 | 90 days supply | Qty: 90 | Fill #0

## 2019-11-16 MED FILL — SIMVASTATIN 20 MG TABLET: 20 | 90 days supply | Qty: 90 | Fill #3

## 2019-12-05 MED FILL — LATANOPROST 0.005% EYE DRP: 0.005 | 90 days supply | Qty: 8 | Fill #0

## 2020-01-13 ENCOUNTER — Other Ambulatory Visit: Payer: Self-pay | Admitting: Internal Medicine

## 2020-01-13 MED FILL — LEVOTHYROXINE 50 MCG TABLET: 50 | 90 days supply | Qty: 90 | Fill #0

## 2020-01-20 ENCOUNTER — Other Ambulatory Visit: Payer: Self-pay | Admitting: Internal Medicine

## 2020-01-20 DIAGNOSIS — Z1231 Encounter for screening mammogram for malignant neoplasm of breast: Secondary | ICD-10-CM

## 2020-01-26 DIAGNOSIS — D2261 Melanocytic nevi of right upper limb, including shoulder: Secondary | ICD-10-CM | POA: Diagnosis not present

## 2020-01-26 DIAGNOSIS — L821 Other seborrheic keratosis: Secondary | ICD-10-CM | POA: Diagnosis not present

## 2020-01-26 DIAGNOSIS — L820 Inflamed seborrheic keratosis: Secondary | ICD-10-CM | POA: Diagnosis not present

## 2020-01-26 DIAGNOSIS — L720 Epidermal cyst: Secondary | ICD-10-CM | POA: Diagnosis not present

## 2020-01-26 DIAGNOSIS — D2271 Melanocytic nevi of right lower limb, including hip: Secondary | ICD-10-CM | POA: Diagnosis not present

## 2020-01-26 DIAGNOSIS — D2272 Melanocytic nevi of left lower limb, including hip: Secondary | ICD-10-CM | POA: Diagnosis not present

## 2020-02-13 ENCOUNTER — Telehealth: Payer: Self-pay | Admitting: Internal Medicine

## 2020-02-13 ENCOUNTER — Ambulatory Visit: Payer: 59 | Admitting: Internal Medicine

## 2020-02-13 ENCOUNTER — Encounter: Payer: Self-pay | Admitting: Internal Medicine

## 2020-02-13 ENCOUNTER — Other Ambulatory Visit: Payer: Self-pay

## 2020-02-13 VITALS — BP 110/70 | HR 78 | Temp 98.7°F | Ht 60.0 in | Wt 110.0 lb

## 2020-02-13 DIAGNOSIS — R3129 Other microscopic hematuria: Secondary | ICD-10-CM | POA: Diagnosis not present

## 2020-02-13 DIAGNOSIS — R1013 Epigastric pain: Secondary | ICD-10-CM

## 2020-02-13 LAB — POCT URINALYSIS DIPSTICK
Appearance: NEGATIVE
Bilirubin, UA: NEGATIVE
Glucose, UA: NEGATIVE
Ketones, UA: NEGATIVE
Leukocytes, UA: NEGATIVE
Nitrite, UA: NEGATIVE
Odor: NEGATIVE
Protein, UA: NEGATIVE
Spec Grav, UA: 1.01 (ref 1.010–1.025)
Urobilinogen, UA: 0.2 E.U./dL
pH, UA: 6.5 (ref 5.0–8.0)

## 2020-02-13 NOTE — Patient Instructions (Signed)
Patient  To have ultrasound of gallbladder in the near future.  Take Nexium 20 mg twice daily.  GI cocktail given in office.  Eat bland diet.  Labs drawn and pending.

## 2020-02-13 NOTE — Telephone Encounter (Signed)
Needs OV.  

## 2020-02-13 NOTE — Telephone Encounter (Signed)
Scheduled appointment

## 2020-02-13 NOTE — Progress Notes (Signed)
   Subjective:    Patient ID: Jacqueline Graham, female    DOB: 1961/01/31, 59 y.o.   MRN: [id number]  HPI 59 year old Female healthy with hx of hypothyroidism and hyperlipidemia. Hx allergic rhinitis. Had colonoscopy in 2015.  Has had vague epigastric discomfort without waterbrash. No fever or chills. Did have diarrhea once. No melena.Also has had some upper back pain in flank area bilaterally. No nausea or vomiting. Going to [facility] this coming Friday.    Review of Systems no UTI symptoms     Objective:   Physical Exam BP 110/70, pulse 78 regular, Weight 110 pounds. T 98.7 degrees  Skin warm and dry.  Abdomen soft nondistended without hepatosplenomegaly masses or significant tenderness.  Dipstick UA shows occult blood which she has had previously.       Assessment & Plan:  Epigastric pain/dyspepsia-?  Gastritis versus possible cholecystitis  Bilateral flank pain  History of occult blood on urine dipstick and this is present again today.  Urine sent for microscopic evaluation.  She has no UTI symptoms.  Plan: Since she is going out of town later this week, we will draw CBC within c-Met amylase lipase.  She will take Nexium 20 mg twice daily.  GI cocktail given in office today.  She will have ultrasound of the gallbladder later this week.

## 2020-02-13 NOTE — Telephone Encounter (Signed)
Jacqueline Graham [id number]  [name] called to say she started having upper abdomen, quadrent pain this weekend, started getting worse yesterday and today, it gets better at times, no fever, no COVID exposure, has had both vaccines.

## 2020-02-14 ENCOUNTER — Other Ambulatory Visit: Payer: Self-pay | Admitting: Internal Medicine

## 2020-02-14 LAB — CBC WITH DIFFERENTIAL/PLATELET
Absolute Monocytes: 426 cells/uL (ref 200–950)
Basophils Absolute: 31 cells/uL (ref 0–200)
Basophils Relative: 0.6 %
Eosinophils Absolute: 42 cells/uL (ref 15–500)
Eosinophils Relative: 0.8 %
HCT: 38.6 % (ref 35.0–45.0)
Hemoglobin: 13 g/dL (ref 11.7–15.5)
Lymphs Abs: 1305 cells/uL (ref 850–3900)
MCH: 31 pg (ref 27.0–33.0)
MCHC: 33.7 g/dL (ref 32.0–36.0)
MCV: 91.9 fL (ref 80.0–100.0)
MPV: 9.6 fL (ref 7.5–12.5)
Monocytes Relative: 8.2 %
Neutro Abs: 3396 cells/uL (ref 1500–7800)
Neutrophils Relative %: 65.3 %
Platelets: 261 10*3/uL (ref 140–400)
RBC: 4.2 10*6/uL (ref 3.80–5.10)
RDW: 12.5 % (ref 11.0–15.0)
Total Lymphocyte: 25.1 %
WBC: 5.2 10*3/uL (ref 3.8–10.8)

## 2020-02-14 LAB — URINALYSIS, MICROSCOPIC ONLY
Bacteria, UA: NONE SEEN /HPF
Hyaline Cast: NONE SEEN /LPF
Squamous Epithelial / HPF: NONE SEEN /HPF (ref <=5)
WBC, UA: NONE SEEN /HPF (ref 0–5)

## 2020-02-14 LAB — COMPLETE METABOLIC PANEL WITH GFR
AG Ratio: 2 (calc) (ref 1.0–2.5)
ALT: 11 U/L (ref 6–29)
AST: 22 U/L (ref 10–35)
Albumin: 4.7 g/dL (ref 3.6–5.1)
Alkaline phosphatase (APISO): 65 U/L (ref 37–153)
BUN: 12 mg/dL (ref 7–25)
CO2: 27 mmol/L (ref 20–32)
Calcium: 9.8 mg/dL (ref 8.6–10.4)
Chloride: 102 mmol/L (ref 98–110)
Creat: 0.97 mg/dL (ref 0.50–1.05)
GFR, Est African American: 75 mL/min/{1.73_m2} (ref >=60)
GFR, Est Non African American: 65 mL/min/{1.73_m2} (ref >=60)
Globulin: 2.4 g/dL (calc) (ref 1.9–3.7)
Glucose, Bld: 91 mg/dL (ref 65–99)
Potassium: 3.9 mmol/L (ref 3.5–5.3)
Sodium: 139 mmol/L (ref 135–146)
Total Bilirubin: 0.5 mg/dL (ref 0.2–1.2)
Total Protein: 7.1 g/dL (ref 6.1–8.1)

## 2020-02-14 LAB — LIPASE: Lipase: 70 U/L — ABNORMAL HIGH (ref 7–60)

## 2020-02-14 LAB — AMYLASE: Amylase: 67 U/L (ref 21–101)

## 2020-02-14 MED FILL — MONTELUKAST SOD 10 MG TAB: 10 | 90 days supply | Qty: 90 | Fill #1

## 2020-02-14 MED FILL — SIMVASTATIN 20 MG TABLET: 20 | 90 days supply | Qty: 90 | Fill #0

## 2020-02-16 ENCOUNTER — Ambulatory Visit (HOSPITAL_COMMUNITY)
Admission: RE | Admit: 2020-02-16 | Discharge: 2020-02-16 | Disposition: A | Discharge status: Home / Self Care | Payer: 59 | Source: Ambulatory Visit | Attending: Internal Medicine | Admitting: Internal Medicine

## 2020-02-16 ENCOUNTER — Other Ambulatory Visit: Payer: Self-pay

## 2020-02-16 ENCOUNTER — Telehealth: Payer: Self-pay | Admitting: Internal Medicine

## 2020-02-16 DIAGNOSIS — R1013 Epigastric pain: Secondary | ICD-10-CM | POA: Insufficient documentation

## 2020-02-16 DIAGNOSIS — K7689 Other specified diseases of liver: Secondary | ICD-10-CM | POA: Diagnosis not present

## 2020-02-16 NOTE — Telephone Encounter (Signed)
Telephone call to patient. Had to leave message. Results from labs [facility] with exception of mild elevation of lipiase ? Mild panceatitis- could be viral or perhaps ETOH if drinking alcohol. Continue to monitor symptoms. Ultrasound unchanged from previous study with no blockages or tumors noted and no gallstones. Asked her to call us with progress report. She is going to [facility].

## 2020-02-23 ENCOUNTER — Other Ambulatory Visit: Payer: Self-pay

## 2020-02-23 ENCOUNTER — Ambulatory Visit
Admission: RE | Admit: 2020-02-23 | Discharge: 2020-02-23 | Disposition: A | Discharge status: Home / Self Care | Payer: 59 | Source: Ambulatory Visit | Attending: Internal Medicine | Admitting: Internal Medicine

## 2020-02-23 DIAGNOSIS — Z1231 Encounter for screening mammogram for malignant neoplasm of breast: Secondary | ICD-10-CM

## 2020-04-09 MED FILL — LATANOPROST 0.005% EYE DRP: 0.005 | 75 days supply | Qty: 8 | Fill #0

## 2020-04-09 MED FILL — LEVOTHYROXINE 50 MCG TABLET: 50 | 90 days supply | Qty: 90 | Fill #1

## 2020-04-30 ENCOUNTER — Other Ambulatory Visit: Payer: 59 | Admitting: Internal Medicine

## 2020-04-30 ENCOUNTER — Other Ambulatory Visit: Payer: Self-pay

## 2020-04-30 DIAGNOSIS — N6019 Diffuse cystic mastopathy of unspecified breast: Secondary | ICD-10-CM | POA: Diagnosis not present

## 2020-04-30 DIAGNOSIS — K219 Gastro-esophageal reflux disease without esophagitis: Secondary | ICD-10-CM

## 2020-04-30 DIAGNOSIS — E039 Hypothyroidism, unspecified: Secondary | ICD-10-CM

## 2020-04-30 DIAGNOSIS — Z0000 Encounter for general adult medical examination without abnormal findings: Secondary | ICD-10-CM | POA: Diagnosis not present

## 2020-04-30 DIAGNOSIS — Z1321 Encounter for screening for nutritional disorder: Secondary | ICD-10-CM | POA: Diagnosis not present

## 2020-04-30 DIAGNOSIS — E7849 Other hyperlipidemia: Secondary | ICD-10-CM

## 2020-04-30 DIAGNOSIS — R748 Abnormal levels of other serum enzymes: Secondary | ICD-10-CM | POA: Diagnosis not present

## 2020-05-03 ENCOUNTER — Ambulatory Visit (INDEPENDENT_AMBULATORY_CARE_PROVIDER_SITE_OTHER): Payer: 59 | Admitting: Internal Medicine

## 2020-05-03 ENCOUNTER — Other Ambulatory Visit: Payer: Self-pay

## 2020-05-03 ENCOUNTER — Encounter: Payer: Self-pay | Admitting: Internal Medicine

## 2020-05-03 VITALS — BP 110/60 | HR 62 | Ht 60.0 in | Wt 108.0 lb

## 2020-05-03 DIAGNOSIS — Z0000 Encounter for general adult medical examination without abnormal findings: Secondary | ICD-10-CM | POA: Diagnosis not present

## 2020-05-03 DIAGNOSIS — R748 Abnormal levels of other serum enzymes: Secondary | ICD-10-CM

## 2020-05-03 DIAGNOSIS — Z8669 Personal history of other diseases of the nervous system and sense organs: Secondary | ICD-10-CM | POA: Diagnosis not present

## 2020-05-03 DIAGNOSIS — Z8249 Family history of ischemic heart disease and other diseases of the circulatory system: Secondary | ICD-10-CM | POA: Diagnosis not present

## 2020-05-03 DIAGNOSIS — E039 Hypothyroidism, unspecified: Secondary | ICD-10-CM | POA: Diagnosis not present

## 2020-05-03 DIAGNOSIS — Z8639 Personal history of other endocrine, nutritional and metabolic disease: Secondary | ICD-10-CM | POA: Diagnosis not present

## 2020-05-03 LAB — POCT URINALYSIS DIPSTICK
Appearance: NEGATIVE
Bilirubin, UA: NEGATIVE
Glucose, UA: NEGATIVE
Ketones, UA: NEGATIVE
Leukocytes, UA: NEGATIVE
Nitrite, UA: NEGATIVE
Odor: NEGATIVE
Protein, UA: NEGATIVE
Spec Grav, UA: 1.01 (ref 1.010–1.025)
Urobilinogen, UA: 0.2 E.U./dL
pH, UA: 5 (ref 5.0–8.0)

## 2020-05-03 MED ORDER — CYCLOBENZAPRINE HCL 10 MG PO TABS: 10.0000 mg | ORAL_TABLET | Freq: Three times a day (TID) | ORAL | 0 refills | Status: DC | PRN

## 2020-05-03 NOTE — Progress Notes (Signed)
Subjective:    Patient ID: Jacqueline Graham, female    DOB: 1961-06-05, 59 y.o.   MRN: [id number]  HPI  59 year old Female for health maintenance exam and evaluation of medical issues.  In March she had an episode of epigastric discomfort without waterbrash.  She had no fever or chills.  She also has some upper back pain in the flank area bilaterally.  She was going to [facility] at the time.  Urine dipstick showed occult blood which she has had previously.  CBC was drawn along with c-Met  and lipase. Her lipase was slightly elevated at 70 normal being between 70 and 60.  She is asking that we repeat this now although she is asymptomatic.  Repeat lipase is still 70.  Have asked patient to consider referral to gastroenterologist.  Her general health is excellent.  She has a history of hypothyroidism and hyperlipidemia.  GE reflux treated with Nexium.  History of allergic rhinitis treated with Flonase and Zyrtec.  History of migraine headaches.  Has glaucoma in both eyes.  Past medical history: Left breast fibroadenoma removed in the 1980s.  [place] spotted fever in 1971.  Social history: Non-smoker.  Social alcohol consumption.  Works as an Engineering geologist at [employer].  She is married.  No children.  2-year college degree.  She stays in excellent physical condition due to being quite active at work.  Family history: Father with history of stroke.  He had a pacemaker and third-degree AV block with syncope.  Mother with history of dementia, kidney stones, acute renal failure after receiving IV contrast dye, hyperlipidemia, hypertension and hypothyroidism.  Mother with history of coronary artery disease status post stent placement.  Paternal grandmother with history of colon cancer.  Maternal grandmother with history of lymphoma.  3 brothers and 1 sister.  1 brother has diabetes, hypertension and kidney stones.    Review of Systems  Constitutional: Negative.   HENT: Negative.   Eyes:        History of glaucoma  Respiratory: Negative.   Cardiovascular: Negative.   Gastrointestinal: Negative.   Genitourinary: Negative.   Neurological:       History of migraine headaches  Psychiatric/Behavioral: Negative.        Objective:   Physical Exam Vitals reviewed.  Constitutional:      Appearance: Normal appearance.  HENT:     Head: Normocephalic.     Right Ear: Tympanic membrane normal.     Left Ear: Tympanic membrane normal.     Nose: Nose normal.  Eyes:     General: No scleral icterus.    Extraocular Movements: Extraocular movements intact.     Conjunctiva/sclera: Conjunctivae normal.     Pupils: Pupils are equal, round, and reactive to light.  Cardiovascular:     Rate and Rhythm: Normal rate and regular rhythm.     Heart sounds: Normal heart sounds. No murmur heard.      Comments: 1/6 systolic ejection murmur Pulmonary:     Effort: Pulmonary effort is normal.     Breath sounds: Normal breath sounds.  Abdominal:     Palpations: Abdomen is soft. There is no mass.     Tenderness: There is no abdominal tenderness. There is no guarding or rebound.  Musculoskeletal:     Cervical back: Neck supple. No rigidity.     Right lower leg: No edema.     Left lower leg: No edema.  Skin:    General: Skin is  warm and dry.  Neurological:     General: No focal deficit present.     Mental Status: She is alert and oriented to person, place, and time.     Cranial Nerves: No cranial nerve deficit.     Gait: Gait normal.     Deep Tendon Reflexes: Reflexes normal.  Psychiatric:        Mood and Affect: Mood normal.        Thought Content: Thought content normal.        Judgment: Judgment normal.           Assessment & Plan:  Hypothyroidism-stable on thyroid replacement therapy continue current dose and return in 1 year  Hyperlipidemia-lipid panel stable on statin  History of glaucoma treated with Xalatan eyedrops by ophthalmology  History of migraine headaches-refill  hydrocodone APAP 10/325 take 1/2 to 1 tablet as needed at onset of migraine headache.  Elevated lipase-relevance uncertain.  It is mildly elevated at 70 and was also elevated 3 months ago when she had some epigastric pain.  Consider referral to gastroenterologist for opinion.  History of benign microscopic hematuria-urine dipstick is normal  History of GE reflux treated with PPI  History of cardiac murmur-mildly thickened leaflets no mitral valve by 2D echocardiogram taken in 2012.  Patient asymptomatic and this is an asymptomatic murmur.

## 2020-05-05 LAB — LIPID PANEL
Cholesterol: 177 mg/dL (ref <200)
HDL: 72 mg/dL (ref >=50)
LDL Cholesterol (Calc): 92 mg/dL (calc)
Non-HDL Cholesterol (Calc): 105 mg/dL (calc) (ref <130)
Total CHOL/HDL Ratio: 2.5 (calc) (ref <5.0)
Triglycerides: 45 mg/dL (ref <150)

## 2020-05-05 LAB — COMPLETE METABOLIC PANEL WITH GFR
AG Ratio: 2 (calc) (ref 1.0–2.5)
ALT: 17 U/L (ref 6–29)
AST: 23 U/L (ref 10–35)
Albumin: 4.4 g/dL (ref 3.6–5.1)
Alkaline phosphatase (APISO): 59 U/L (ref 37–153)
BUN: 22 mg/dL (ref 7–25)
CO2: 27 mmol/L (ref 20–32)
Calcium: 9.7 mg/dL (ref 8.6–10.4)
Chloride: 102 mmol/L (ref 98–110)
Creat: 1.02 mg/dL (ref 0.50–1.05)
GFR, Est African American: 71 mL/min/{1.73_m2} (ref >=60)
GFR, Est Non African American: 61 mL/min/{1.73_m2} (ref >=60)
Globulin: 2.2 g/dL (calc) (ref 1.9–3.7)
Glucose, Bld: 78 mg/dL (ref 65–99)
Potassium: 4.7 mmol/L (ref 3.5–5.3)
Sodium: 137 mmol/L (ref 135–146)
Total Bilirubin: 0.5 mg/dL (ref 0.2–1.2)
Total Protein: 6.6 g/dL (ref 6.1–8.1)

## 2020-05-05 LAB — CBC WITH DIFFERENTIAL/PLATELET
Absolute Monocytes: 410 cells/uL (ref 200–950)
Basophils Absolute: 30 cells/uL (ref 0–200)
Basophils Relative: 0.6 %
Eosinophils Absolute: 90 cells/uL (ref 15–500)
Eosinophils Relative: 1.8 %
HCT: 36.1 % (ref 35.0–45.0)
Hemoglobin: 12.5 g/dL (ref 11.7–15.5)
Lymphs Abs: 1480 cells/uL (ref 850–3900)
MCH: 31 pg (ref 27.0–33.0)
MCHC: 34.6 g/dL (ref 32.0–36.0)
MCV: 89.6 fL (ref 80.0–100.0)
MPV: 10.1 fL (ref 7.5–12.5)
Monocytes Relative: 8.2 %
Neutro Abs: 2990 cells/uL (ref 1500–7800)
Neutrophils Relative %: 59.8 %
Platelets: 210 10*3/uL (ref 140–400)
RBC: 4.03 10*6/uL (ref 3.80–5.10)
RDW: 12.2 % (ref 11.0–15.0)
Total Lymphocyte: 29.6 %
WBC: 5 10*3/uL (ref 3.8–10.8)

## 2020-05-05 LAB — TEST AUTHORIZATION

## 2020-05-05 LAB — TSH: TSH: 1.52 mIU/L (ref 0.40–4.50)

## 2020-05-05 LAB — LIPASE: Lipase: 70 U/L — ABNORMAL HIGH (ref 7–60)

## 2020-05-05 LAB — VITAMIN D 25 HYDROXY (VIT D DEFICIENCY, FRACTURES): Vit D, 25-Hydroxy: 43 ng/mL (ref 30–100)

## 2020-05-05 MED ORDER — HYDROCODONE-ACETAMINOPHEN 10-325 MG PO TABS: 1.0000 | ORAL_TABLET | Freq: Three times a day (TID) | ORAL | 0 refills | Status: AC | PRN

## 2020-05-05 NOTE — Patient Instructions (Signed)
It was a pleasure to see you today.  Consider GI referral for very mildly elevated lipase level.  Continue current medications and return in 1 year.  Hydrocodone APAP refilled for migraine headaches.

## 2020-05-07 MED FILL — HYDROCODON-APAP 10-325: 10-325 | 5 days supply | Qty: 15 | Fill #0

## 2020-05-15 DIAGNOSIS — H401131 Primary open-angle glaucoma, bilateral, mild stage: Secondary | ICD-10-CM | POA: Diagnosis not present

## 2020-06-01 MED FILL — SIMVASTATIN 20 MG TABLET: 20 | 90 days supply | Qty: 90 | Fill #1

## 2020-06-01 MED FILL — MONTELUKAST SOD 10 MG TAB: 10 | 90 days supply | Qty: 90 | Fill #2

## 2020-07-16 ENCOUNTER — Other Ambulatory Visit: Payer: Self-pay | Admitting: Internal Medicine

## 2020-07-16 MED FILL — LEVOTHYROXINE 50 MCG TABLET: 50 | 90 days supply | Qty: 90 | Fill #0

## 2020-07-16 MED FILL — LATANOPROST 0.005% EYE DRP: 0.005 | 75 days supply | Qty: 8 | Fill #0

## 2020-09-10 MED FILL — SIMVASTATIN 20 MG TABLET: 20 | 90 days supply | Qty: 90 | Fill #2

## 2020-09-10 MED FILL — MONTELUKAST SOD 10 MG TAB: 10 | 90 days supply | Qty: 90 | Fill #3

## 2020-10-25 MED FILL — LEVOTHYROXINE 50 MCG TABLET: 50 | 90 days supply | Qty: 90 | Fill #1

## 2020-11-09 MED FILL — LATANOPROST 0.005% EYE DRP: 0.005 | 75 days supply | Qty: 8 | Fill #1

## 2020-11-14 DIAGNOSIS — H524 Presbyopia: Secondary | ICD-10-CM | POA: Diagnosis not present

## 2020-11-14 DIAGNOSIS — H401131 Primary open-angle glaucoma, bilateral, mild stage: Secondary | ICD-10-CM | POA: Diagnosis not present

## 2020-11-14 DIAGNOSIS — H2513 Age-related nuclear cataract, bilateral: Secondary | ICD-10-CM | POA: Diagnosis not present

## 2020-11-14 DIAGNOSIS — H5203 Hypermetropia, bilateral: Secondary | ICD-10-CM | POA: Diagnosis not present

## 2020-12-13 ENCOUNTER — Other Ambulatory Visit: Payer: Self-pay

## 2020-12-13 ENCOUNTER — Ambulatory Visit
Admission: EM | Admit: 2020-12-13 | Discharge: 2020-12-13 | Disposition: A | Discharge status: Home / Self Care | Payer: 59 | Attending: Emergency Medicine | Admitting: Emergency Medicine

## 2020-12-13 ENCOUNTER — Ambulatory Visit: Payer: Self-pay

## 2020-12-13 ENCOUNTER — Other Ambulatory Visit: Payer: Self-pay | Admitting: Emergency Medicine

## 2020-12-13 DIAGNOSIS — J0100 Acute maxillary sinusitis, unspecified: Secondary | ICD-10-CM

## 2020-12-13 DIAGNOSIS — J069 Acute upper respiratory infection, unspecified: Secondary | ICD-10-CM | POA: Diagnosis not present

## 2020-12-13 DIAGNOSIS — H6123 Impacted cerumen, bilateral: Secondary | ICD-10-CM

## 2020-12-13 DIAGNOSIS — Z20822 Contact with and (suspected) exposure to covid-19: Secondary | ICD-10-CM

## 2020-12-13 MED ORDER — AZITHROMYCIN 250 MG PO TABS: 250.0000 mg | ORAL_TABLET | Freq: Every day | ORAL | 0 refills | Status: DC

## 2020-12-13 MED FILL — AZITHROMYCIN 250 MG TABLET: 250 | 5 days supply | Qty: 6 | Fill #0

## 2020-12-13 NOTE — ED Triage Notes (Signed)
Pt c/o sinus pressure with green secretions and bilateral ear ache x3 days

## 2020-12-13 NOTE — ED Provider Notes (Signed)
[facility]    CSN: [id number] Arrival date & time: 12/13/20  0858      History   Chief Complaint Chief Complaint  Patient presents with  . appt 9- sinus infection and ear pain    HPI Jacqueline Graham is a 60 y.o. female  History as below presenting for 10-day course of nasal congestion, bilateral ear aching.  States she is most bothered by decreased hearing and aching.  No fever, cough, difficulty breathing.  Does have mild postnasal drip.  Is COVID vaccinated, denies known sick contacts.  Has tried Flonase, Singulair without relief.  Past Medical History:  Diagnosis Date  . Hyperlipidemia     Patient Active Problem List   Diagnosis Date Noted  . Allergic rhinitis 02/21/2016  . Family history of heart disease 02/21/2016  . History of migraine headaches 02/21/2016  . GERD (gastroesophageal reflux disease) 08/19/2015  . Palpitations 02/21/2013  . Hypothyroidism 08/20/2011  . Glaucoma 08/20/2011  . Hyperlipidemia 08/20/2011  . Fibrocystic breast disease 08/20/2011  . Benign microscopic hematuria 08/20/2011    Past Surgical History:  Procedure Laterality Date  . BREAST SURGERY     fibroadenoma  . GUM SURGERY      OB History   No obstetric history on file.      Home Medications    Prior to Admission medications   Medication Sig Start Date End Date Taking? Authorizing Provider  azithromycin (ZITHROMAX) 250 MG tablet Take 1 tablet (250 mg total) by mouth daily. Take first 2 tablets together, then 1 every day until finished. 12/13/20  Yes Hall-Potvin, [place], PA-C  calcium carbonate (OS-CAL) 600 MG TABS Take 600 mg by mouth 2 (two) times daily with a meal.      [provider]  cyclobenzaprine (FLEXERIL) 10 MG tablet Take 1 tablet (10 mg total) by mouth 3 (three) times daily as needed for muscle spasms. 05/03/20   Elby Showers, MD  fluticasone (FLONASE) 50 MCG/ACT nasal spray Place 1 spray into both nostrils daily.    [provider]   latanoprost (XALATAN) 0.005 % ophthalmic solution 1 drop at bedtime.      [provider]  levothyroxine (SYNTHROID) 50 MCG tablet TAKE 1 TABLET BY MOUTH DAILY. 07/16/20   Elby Showers, MD  montelukast (SINGULAIR) 10 MG tablet TAKE 1 TABLET (10 MG TOTAL) BY MOUTH AT BEDTIME. 11/16/19   Elby Showers, MD  simvastatin (ZOCOR) 20 MG tablet TAKE 1 TABLET BY MOUTH ONCE DAILY 02/14/20   Elby Showers, MD    Family History Family History  Problem Relation Age of Onset  . Diabetes Mother   . Hypertension Mother   . Heart disease Mother   . Hyperlipidemia Mother   . Heart disease Father   . Stroke Father     Social History Social History   Tobacco Use  . Smoking status: Never Smoker  . Smokeless tobacco: Never Used  Substance Use Topics  . Alcohol use: Yes    Alcohol/week: 0.0 standard drinks    Comment: social  . Drug use: No     Allergies   Amoxicillin and Sulfa antibiotics   Review of Systems Review of Systems  Constitutional: Negative for fatigue and fever.  HENT: Positive for congestion and ear pain. Negative for dental problem, ear discharge, facial swelling, hearing loss, sinus pain, sore throat, trouble swallowing and voice change.   Eyes: Negative for photophobia, pain and visual disturbance.  Respiratory: Negative for cough and shortness of  breath.   Cardiovascular: Negative for chest pain and palpitations.  Gastrointestinal: Negative for diarrhea and vomiting.  Musculoskeletal: Negative for arthralgias and myalgias.  Neurological: Negative for dizziness and headaches.     Physical Exam Triage Vital Signs ED Triage Vitals  Enc Vitals Group     BP 12/13/20 0906 135/76     Pulse Rate 12/13/20 0906 83     Resp 12/13/20 0906 18     Temp 12/13/20 0906 98.2 F (36.8 C)     Temp Source 12/13/20 0906 Oral     SpO2 12/13/20 0906 97 %     Weight --      Height --      Head Circumference --      Peak Flow --      Pain Score 12/13/20 0915 6     Pain  Loc --      Pain Edu? --      Excl. in [place]? --    No data found.  Updated Vital Signs BP 135/76   Pulse 83   Temp 98.2 F (36.8 C) (Oral)   Resp 18   SpO2 97%   Visual Acuity Right Eye Distance:   Left Eye Distance:   Bilateral Distance:    Right Eye Near:   Left Eye Near:    Bilateral Near:     Physical Exam Constitutional:      General: She is not in acute distress.    Appearance: She is not ill-appearing.  HENT:     Head: Normocephalic and atraumatic.     Jaw: There is normal jaw occlusion. No tenderness or pain on movement.     Right Ear: Hearing and external ear normal. No tenderness. There is impacted cerumen. No mastoid tenderness.     Left Ear: Hearing and external ear normal. No tenderness. There is impacted cerumen. No mastoid tenderness.     Ears:     Comments: S/P irrigation: Mild TM retraction, the no perforation.  Bony landmarks intact    Nose: No nasal deformity, septal deviation or nasal tenderness.     Right Turbinates: Not swollen or pale.     Left Turbinates: Not swollen or pale.     Right Sinus: No maxillary sinus tenderness or frontal sinus tenderness.     Left Sinus: No maxillary sinus tenderness or frontal sinus tenderness.     Comments: Mucosal erythema with bilateral maxillary sinus tenderness    Mouth/Throat:     Lips: Pink. No lesions.     Mouth: Mucous membranes are moist. No injury.     Pharynx: Oropharynx is clear. Uvula midline. No posterior oropharyngeal erythema or uvula swelling.     Comments: no tonsillar exudate or hypertrophy Eyes:     General: No scleral icterus.    Conjunctiva/sclera: Conjunctivae normal.     Pupils: Pupils are equal, round, and reactive to light.  Cardiovascular:     Rate and Rhythm: Normal rate.  Pulmonary:     Effort: Pulmonary effort is normal. No respiratory distress.     Breath sounds: No wheezing.  Musculoskeletal:     Cervical back: Normal range of motion and neck supple. No muscular tenderness.   Lymphadenopathy:     Cervical: No cervical adenopathy.  Neurological:     Mental Status: She is alert and oriented to person, place, and time.      UC Treatments / Results  Labs (all labs ordered are listed, but only abnormal results are displayed) Labs Reviewed  NOVEL  CORONAVIRUS, NAA    EKG   Radiology No results found.  Procedures Procedures (including critical care time)  Medications Ordered in UC Medications - No data to display  Initial Impression / Assessment and Plan / UC Course  I have reviewed the triage vital signs and the nursing notes.  Pertinent labs & imaging results that were available during my care of the patient were reviewed by me and considered in my medical decision making (see chart for details).     Patient afebrile, nontoxic, with SpO2 97%.  Patient does have bilateral cerumen impaction: Repeat exam benign.  Covid PCR pending.  Patient to quarantine until results are back.  We will treat supportively as outlined below, cover for ABS given severity/chronicity of symptoms.  Return precautions discussed, patient verbalized understanding and is agreeable to plan. Final Clinical Impressions(s) / UC Diagnoses   Final diagnoses:  Encounter for screening laboratory testing for COVID-19 virus  Bilateral hearing loss due to cerumen impaction  Acute maxillary sinusitis, recurrence not specified   Discharge Instructions   None    ED Prescriptions    Medication Sig Dispense Auth. Provider   azithromycin (ZITHROMAX) 250 MG tablet Take 1 tablet (250 mg total) by mouth daily. Take first 2 tablets together, then 1 every day until finished. 6 tablet Hall-Potvin, [place], PA-C     PDMP not reviewed this encounter.   Hall-Potvin, [place], [place] 12/13/20 1037

## 2020-12-15 LAB — SARS-COV-2, NAA 2 DAY TAT

## 2020-12-15 LAB — NOVEL CORONAVIRUS, NAA: SARS-CoV-2, NAA: NOT DETECTED

## 2020-12-17 ENCOUNTER — Other Ambulatory Visit: Payer: Self-pay

## 2020-12-17 ENCOUNTER — Other Ambulatory Visit: Payer: Self-pay | Admitting: Internal Medicine

## 2020-12-17 ENCOUNTER — Ambulatory Visit: Payer: 59 | Admitting: Internal Medicine

## 2020-12-17 ENCOUNTER — Telehealth: Payer: Self-pay | Admitting: Internal Medicine

## 2020-12-17 ENCOUNTER — Encounter: Payer: Self-pay | Admitting: Internal Medicine

## 2020-12-17 VITALS — BP 110/70 | HR 73 | Temp 97.7°F | Ht 60.0 in | Wt 109.0 lb

## 2020-12-17 DIAGNOSIS — H6693 Otitis media, unspecified, bilateral: Secondary | ICD-10-CM

## 2020-12-17 MED ORDER — FLUCONAZOLE 150 MG PO TABS: 150.0000 mg | ORAL_TABLET | Freq: Once | ORAL | 0 refills | Status: DC

## 2020-12-17 MED ORDER — LEVOFLOXACIN 500 MG PO TABS: 500.0000 mg | ORAL_TABLET | Freq: Every day | ORAL | 0 refills | Status: DC

## 2020-12-17 MED ORDER — METHYLPREDNISOLONE ACETATE 80 MG/ML IJ SUSP
80.0000 mg | Freq: Once | INTRAMUSCULAR | Status: AC
  Administered 2020-12-17: 80 mg via INTRAMUSCULAR

## 2020-12-17 MED FILL — levoFLOXacin 500 MG TABS: 500 | 10 days supply | Qty: 10 | Fill #0

## 2020-12-17 MED FILL — FLUCONAZOLE 150 MG TABS: 150 | 1 days supply | Qty: 1 | Fill #0

## 2020-12-17 NOTE — Progress Notes (Signed)
   Subjective:    Patient ID: Jacqueline Graham, female    DOB: 1961-02-06, 60 y.o.   MRN: [id number]  HPI 60 year old Female seen today in person regarding ear congestion and decreased hearing. She was seen at Urgent Care January 20th. Was found to have impacted cerumen which was removed with irrigation. Was placed on a Zithromax Z-pak. Has not improved. Says she cannot hear well and asks me to repeat myself several times today. No fever. Works as Research officer, political party at [employer]. Has been vaccinated for [employer]. Had onset of nasal congestion some 10 days before seeking treatment. Has had negative Covid-19 test through health at Work.  Hx of allergic rhinitis, galucoma, and hypothyroidism.  Hx of hyperlipidemia treated with Zocor.  Review of Systems no fever, chills, dysgeusia     Objective:   Physical Exam BP 100/70. Pulse 73 regular, T 97.7 pulse os 98% weight 109 pounds. BMI 21.29  Not hearing well today but I am wearing an N-95 mask in exam room.  TMs are red bilaterally. Both canals are clear and are not inflamed. TMs are dull bilaterally     Assessment & Plan:  Acute bilateral otitis media  History of cerumen impaction-was removed at urgent care and ear canals are clear  Plan: Levaquin 500 mg daily for 10 days.  May take Diflucan 150 mg tablet if develop Candida vaginitis while on Levaquin.  Depo-Medrol 80 mg IM which should help with your congestion.  Call if not improving by the end of the week.

## 2020-12-17 NOTE — Telephone Encounter (Signed)
Scheduled

## 2020-12-17 NOTE — Patient Instructions (Addendum)
Levaquin 500 mg daily for 10 days.  Diflucan 150 mg tablet if needed for Candida vaginitis while on antibiotics.  Depo-Medrol 80 mg IM given in office today.  Call if not better by the end of the week.

## 2020-12-17 NOTE — Telephone Encounter (Signed)
Office visit

## 2020-12-17 NOTE — Telephone Encounter (Signed)
Tom Ragsdale [phone]  Jeannine called to say last week she went to [facility] on [address] with her ears and they are no better, they fell stopped up, can't hear, and they ache, they gave her a Zpack and she finished it this morning, her COVID test was negative.

## 2020-12-28 MED FILL — SIMVASTATIN 20 MG TABLET: 20 | 90 days supply | Qty: 90 | Fill #3

## 2020-12-31 ENCOUNTER — Ambulatory Visit: Payer: 59 | Admitting: Internal Medicine

## 2020-12-31 ENCOUNTER — Encounter: Payer: Self-pay | Admitting: Internal Medicine

## 2020-12-31 ENCOUNTER — Other Ambulatory Visit: Payer: Self-pay | Admitting: Internal Medicine

## 2020-12-31 ENCOUNTER — Other Ambulatory Visit: Payer: Self-pay

## 2020-12-31 VITALS — BP 110/70 | HR 70 | Temp 98.4°F | Ht 60.0 in | Wt 109.0 lb

## 2020-12-31 DIAGNOSIS — H6690 Otitis media, unspecified, unspecified ear: Secondary | ICD-10-CM

## 2020-12-31 MED ORDER — CLINDAMYCIN HCL 300 MG PO CAPS: 300.0000 mg | ORAL_CAPSULE | Freq: Three times a day (TID) | ORAL | 0 refills | Status: DC

## 2020-12-31 MED ORDER — PREDNISONE 10 MG PO TABS: ORAL_TABLET | ORAL | 0 refills | Status: DC

## 2020-12-31 MED FILL — predniSONE 10 MG TABS: 10 | 6 days supply | Qty: 21 | Fill #0

## 2020-12-31 MED FILL — CLINDAMYCIN HCL 300 MG CAPS: 300 | 10 days supply | Qty: 30 | Fill #0

## 2020-12-31 NOTE — Telephone Encounter (Signed)
Jacqueline Graham called to say she finished her antibiotic last Wednesday, however her ears are better but they are still not opened up she is still having trouble hearing out of them.

## 2020-12-31 NOTE — Telephone Encounter (Signed)
Can she come  for visit today or tomorrow?

## 2020-12-31 NOTE — Progress Notes (Signed)
   Subjective:    Patient ID: Jacqueline Graham, female    DOB: [birth year], 59 y.o.   MRN: [id number]  HPI 60 year old Female seen today with persistent ear pain. Seen here initially January 24 here for ear pain.Prior to that visit, she was seen at Urgent Care on January 20.  Was found on January 20th to have impacted cerumen which was removed with irrigation and was placed on Zithromax Z-PAK.  Symptoms did not improve prompting visit on January 24 here.  Her hearing appeared to be decreased on January 24.  TMs were red bilaterally.  Canals were clear and noninflamed.  TMs were dull bilaterally. She was placed on Levaquin 500 mg daily for 10 days and given Depo-Medrol 80 mg IM.  Still having feelings of stopped up ears.   Review of Systems see above.  No fever, chills, known Covid exposure, sore throat or cough.       Objective:   Physical Exam Blood pressure 110/70 pulse 70 temperature 98.4 degrees pulse oximetry 98% weight 109 pounds BMI 21.29  Skin warm and dry.  Pharynx is clear.  Neck is supple.  TMs remain dull bilaterally.  They are pink.       Assessment & Plan:  Persistent bilateral otitis media present since around January 20.  Has not responded to Zithromax Z-Pak nor Levaquin plus Depo-Medrol.  Plan: We will try Cleocin 300 mg 3 times a day for 10 days and make ENT referral.  She has no cerumen in her ear canals at all.

## 2020-12-31 NOTE — Telephone Encounter (Signed)
Scheduled

## 2021-01-03 ENCOUNTER — Other Ambulatory Visit: Payer: Self-pay | Admitting: Internal Medicine

## 2021-01-03 MED FILL — MONTELUKAST SOD 10 MG TAB: 10 | 90 days supply | Qty: 90 | Fill #0

## 2021-01-10 NOTE — Telephone Encounter (Signed)
Jacqueline Graham called to say she finished her antibiotic yesterday and her ear is still stopped up, it is better but not cleared up. She said you had mentioned something about if it did not get better she may need to be referred to ENT.

## 2021-01-10 NOTE — Telephone Encounter (Signed)
Please call [facility] and see if they can see her next week.

## 2021-01-13 NOTE — Patient Instructions (Signed)
Try Cleocin 300 mg 3 times a day for 10 days.  ENT referral will be made.

## 2021-01-14 MED FILL — MONTELUKAST SOD 10 MG TAB: 10 | 90 days supply | Qty: 90 | Fill #0

## 2021-01-17 DIAGNOSIS — J301 Allergic rhinitis due to pollen: Secondary | ICD-10-CM | POA: Diagnosis not present

## 2021-01-17 DIAGNOSIS — H90A11 Conductive hearing loss, unilateral, right ear with restricted hearing on the contralateral side: Secondary | ICD-10-CM | POA: Diagnosis not present

## 2021-01-17 DIAGNOSIS — H90A22 Sensorineural hearing loss, unilateral, left ear, with restricted hearing on the contralateral side: Secondary | ICD-10-CM | POA: Diagnosis not present

## 2021-01-17 DIAGNOSIS — H903 Sensorineural hearing loss, bilateral: Secondary | ICD-10-CM | POA: Diagnosis not present

## 2021-01-17 DIAGNOSIS — H6983 Other specified disorders of Eustachian tube, bilateral: Secondary | ICD-10-CM | POA: Diagnosis not present

## 2021-01-22 ENCOUNTER — Other Ambulatory Visit: Payer: Self-pay | Admitting: Internal Medicine

## 2021-01-22 DIAGNOSIS — Z1231 Encounter for screening mammogram for malignant neoplasm of breast: Secondary | ICD-10-CM

## 2021-02-12 ENCOUNTER — Other Ambulatory Visit: Payer: Self-pay | Admitting: Internal Medicine

## 2021-02-12 MED FILL — LEVOTHYROXINE 50 MCG TABLET: 50 | 90 days supply | Qty: 90 | Fill #0

## 2021-03-14 ENCOUNTER — Other Ambulatory Visit: Payer: Self-pay

## 2021-03-14 ENCOUNTER — Ambulatory Visit
Admission: RE | Admit: 2021-03-14 | Discharge: 2021-03-14 | Disposition: A | Discharge status: Home / Self Care | Payer: 59 | Source: Ambulatory Visit | Attending: Internal Medicine | Admitting: Internal Medicine

## 2021-03-14 DIAGNOSIS — Z1231 Encounter for screening mammogram for malignant neoplasm of breast: Secondary | ICD-10-CM | POA: Diagnosis not present

## 2021-04-01 ENCOUNTER — Other Ambulatory Visit: Payer: Self-pay | Admitting: Internal Medicine

## 2021-04-01 ENCOUNTER — Other Ambulatory Visit (HOSPITAL_COMMUNITY): Payer: Self-pay

## 2021-04-01 MED ORDER — SIMVASTATIN 20 MG PO TABS
20.0000 mg | ORAL_TABLET | Freq: Every day | ORAL | 3 refills | Status: DC
  Filled 2021-04-01: qty 90, 90d supply, fill #0
  Filled 2021-07-02: qty 90, 90d supply, fill #1
  Filled 2021-10-08: qty 90, 90d supply, fill #2
  Filled 2022-01-02: qty 90, 90d supply, fill #3

## 2021-04-01 MED FILL — Latanoprost Ophth Soln 0.005%: OPHTHALMIC | 75 days supply | Qty: 7.5 | Fill #0 | Status: AC

## 2021-04-18 ENCOUNTER — Other Ambulatory Visit (HOSPITAL_COMMUNITY): Payer: Self-pay

## 2021-04-18 MED FILL — Montelukast Sodium Tab 10 MG (Base Equiv): ORAL | 90 days supply | Qty: 90 | Fill #0 | Status: AC

## 2021-05-03 ENCOUNTER — Other Ambulatory Visit: Payer: 59 | Admitting: Internal Medicine

## 2021-05-07 ENCOUNTER — Encounter: Payer: 59 | Admitting: Internal Medicine

## 2021-05-21 DIAGNOSIS — H401131 Primary open-angle glaucoma, bilateral, mild stage: Secondary | ICD-10-CM | POA: Diagnosis not present

## 2021-05-22 ENCOUNTER — Other Ambulatory Visit (HOSPITAL_COMMUNITY): Payer: Self-pay

## 2021-05-22 ENCOUNTER — Other Ambulatory Visit: Payer: Self-pay | Admitting: Orthopaedic Surgery

## 2021-05-22 MED ORDER — PREDNISONE 10 MG (21) PO TBPK
ORAL_TABLET | ORAL | 0 refills | Status: DC
  Filled 2021-05-22: qty 21, 6d supply, fill #0

## 2021-05-23 ENCOUNTER — Other Ambulatory Visit (HOSPITAL_COMMUNITY): Payer: Self-pay

## 2021-05-23 ENCOUNTER — Other Ambulatory Visit: Payer: Self-pay | Admitting: Internal Medicine

## 2021-05-23 MED ORDER — LEVOTHYROXINE SODIUM 50 MCG PO TABS
50.0000 ug | ORAL_TABLET | Freq: Every day | ORAL | 0 refills | Status: DC
  Filled 2021-05-23: qty 90, 90d supply, fill #0

## 2021-07-02 ENCOUNTER — Other Ambulatory Visit (HOSPITAL_COMMUNITY): Payer: Self-pay

## 2021-07-09 ENCOUNTER — Other Ambulatory Visit (HOSPITAL_COMMUNITY): Payer: Self-pay

## 2021-07-09 MED ORDER — CARESTART COVID-19 HOME TEST VI KIT
PACK | 0 refills | Status: DC
  Filled 2021-07-09: qty 2, 2d supply, fill #0

## 2021-08-01 ENCOUNTER — Other Ambulatory Visit (HOSPITAL_COMMUNITY): Payer: Self-pay

## 2021-08-01 MED FILL — Montelukast Sodium Tab 10 MG (Base Equiv): ORAL | 90 days supply | Qty: 90 | Fill #1 | Status: AC

## 2021-08-02 ENCOUNTER — Other Ambulatory Visit: Payer: Self-pay

## 2021-08-02 ENCOUNTER — Other Ambulatory Visit (HOSPITAL_COMMUNITY): Payer: Self-pay

## 2021-08-05 ENCOUNTER — Other Ambulatory Visit (HOSPITAL_COMMUNITY): Payer: Self-pay

## 2021-08-05 MED ORDER — LATANOPROST 0.005 % OP SOLN
1.0000 [drp] | Freq: Every day | OPHTHALMIC | 1 refills | Status: DC
  Filled 2021-08-05: qty 7.5, 75d supply, fill #0
  Filled 2021-11-04: qty 7.5, 75d supply, fill #1

## 2021-08-06 ENCOUNTER — Other Ambulatory Visit: Payer: Self-pay

## 2021-08-06 ENCOUNTER — Other Ambulatory Visit: Payer: 59 | Admitting: Internal Medicine

## 2021-08-06 ENCOUNTER — Other Ambulatory Visit (HOSPITAL_COMMUNITY): Payer: Self-pay

## 2021-08-06 DIAGNOSIS — Z8639 Personal history of other endocrine, nutritional and metabolic disease: Secondary | ICD-10-CM

## 2021-08-06 DIAGNOSIS — E7849 Other hyperlipidemia: Secondary | ICD-10-CM

## 2021-08-06 DIAGNOSIS — E039 Hypothyroidism, unspecified: Secondary | ICD-10-CM | POA: Diagnosis not present

## 2021-08-06 DIAGNOSIS — Z0000 Encounter for general adult medical examination without abnormal findings: Secondary | ICD-10-CM

## 2021-08-06 DIAGNOSIS — R748 Abnormal levels of other serum enzymes: Secondary | ICD-10-CM | POA: Diagnosis not present

## 2021-08-07 LAB — LIPASE: Lipase: 59 U/L (ref 7–60)

## 2021-08-07 LAB — COMPLETE METABOLIC PANEL WITH GFR
AG Ratio: 1.9 (calc) (ref 1.0–2.5)
ALT: 12 U/L (ref 6–29)
AST: 21 U/L (ref 10–35)
Albumin: 4.4 g/dL (ref 3.6–5.1)
Alkaline phosphatase (APISO): 68 U/L (ref 37–153)
BUN: 17 mg/dL (ref 7–25)
CO2: 28 mmol/L (ref 20–32)
Calcium: 9.7 mg/dL (ref 8.6–10.4)
Chloride: 102 mmol/L (ref 98–110)
Creat: 1.01 mg/dL (ref 0.50–1.03)
Globulin: 2.3 g/dL (calc) (ref 1.9–3.7)
Glucose, Bld: 73 mg/dL (ref 65–99)
Potassium: 5.2 mmol/L (ref 3.5–5.3)
Sodium: 138 mmol/L (ref 135–146)
Total Bilirubin: 0.4 mg/dL (ref 0.2–1.2)
Total Protein: 6.7 g/dL (ref 6.1–8.1)
eGFR: 65 mL/min/{1.73_m2} (ref >=60)

## 2021-08-07 LAB — CBC WITH DIFFERENTIAL/PLATELET
Absolute Monocytes: 427 cells/uL (ref 200–950)
Basophils Absolute: 31 cells/uL (ref 0–200)
Basophils Relative: 0.7 %
Eosinophils Absolute: 79 cells/uL (ref 15–500)
Eosinophils Relative: 1.8 %
HCT: 35.7 % (ref 35.0–45.0)
Hemoglobin: 12.2 g/dL (ref 11.7–15.5)
Lymphs Abs: 1267 cells/uL (ref 850–3900)
MCH: 31.7 pg (ref 27.0–33.0)
MCHC: 34.2 g/dL (ref 32.0–36.0)
MCV: 92.7 fL (ref 80.0–100.0)
MPV: 9.5 fL (ref 7.5–12.5)
Monocytes Relative: 9.7 %
Neutro Abs: 2596 cells/uL (ref 1500–7800)
Neutrophils Relative %: 59 %
Platelets: 205 10*3/uL (ref 140–400)
RBC: 3.85 10*6/uL (ref 3.80–5.10)
RDW: 12.5 % (ref 11.0–15.0)
Total Lymphocyte: 28.8 %
WBC: 4.4 10*3/uL (ref 3.8–10.8)

## 2021-08-07 LAB — LIPID PANEL
Cholesterol: 188 mg/dL (ref <200)
HDL: 67 mg/dL (ref >=50)
LDL Cholesterol (Calc): 105 mg/dL (calc) — ABNORMAL HIGH
Non-HDL Cholesterol (Calc): 121 mg/dL (calc) (ref <130)
Total CHOL/HDL Ratio: 2.8 (calc) (ref <5.0)
Triglycerides: 72 mg/dL (ref <150)

## 2021-08-07 LAB — TSH: TSH: 2.39 mIU/L (ref 0.40–4.50)

## 2021-08-09 ENCOUNTER — Encounter: Payer: Self-pay | Admitting: Internal Medicine

## 2021-08-09 ENCOUNTER — Ambulatory Visit (INDEPENDENT_AMBULATORY_CARE_PROVIDER_SITE_OTHER): Payer: 59 | Admitting: Internal Medicine

## 2021-08-09 ENCOUNTER — Other Ambulatory Visit (HOSPITAL_COMMUNITY): Payer: Self-pay

## 2021-08-09 ENCOUNTER — Other Ambulatory Visit (HOSPITAL_COMMUNITY)
Admission: RE | Admit: 2021-08-09 | Discharge: 2021-08-09 | Disposition: A | Discharge status: Home / Self Care | Payer: 59 | Source: Ambulatory Visit | Attending: Internal Medicine | Admitting: Internal Medicine

## 2021-08-09 ENCOUNTER — Other Ambulatory Visit: Payer: Self-pay

## 2021-08-09 VITALS — BP 120/80 | HR 80 | Ht 60.0 in | Wt 113.0 lb

## 2021-08-09 DIAGNOSIS — R319 Hematuria, unspecified: Secondary | ICD-10-CM

## 2021-08-09 DIAGNOSIS — Z8669 Personal history of other diseases of the nervous system and sense organs: Secondary | ICD-10-CM | POA: Diagnosis not present

## 2021-08-09 DIAGNOSIS — Z8639 Personal history of other endocrine, nutritional and metabolic disease: Secondary | ICD-10-CM

## 2021-08-09 DIAGNOSIS — Z0000 Encounter for general adult medical examination without abnormal findings: Secondary | ICD-10-CM | POA: Insufficient documentation

## 2021-08-09 DIAGNOSIS — E039 Hypothyroidism, unspecified: Secondary | ICD-10-CM | POA: Diagnosis not present

## 2021-08-09 DIAGNOSIS — N029 Recurrent and persistent hematuria with unspecified morphologic changes: Secondary | ICD-10-CM

## 2021-08-09 DIAGNOSIS — K219 Gastro-esophageal reflux disease without esophagitis: Secondary | ICD-10-CM

## 2021-08-09 LAB — POCT URINALYSIS DIPSTICK
Bilirubin, UA: NEGATIVE
Glucose, UA: NEGATIVE
Ketones, UA: NEGATIVE
Leukocytes, UA: NEGATIVE
Nitrite, UA: NEGATIVE
Protein, UA: NEGATIVE
Spec Grav, UA: 1.01 (ref 1.010–1.025)
Urobilinogen, UA: 0.2 E.U./dL
pH, UA: 6.5 (ref 5.0–8.0)

## 2021-08-09 MED ORDER — CYCLOBENZAPRINE HCL 10 MG PO TABS
10.0000 mg | ORAL_TABLET | Freq: Three times a day (TID) | ORAL | 3 refills | Status: DC | PRN
  Filled 2021-08-09 – 2021-08-19 (×2): qty 30, 10d supply, fill #0
  Filled 2022-02-20: qty 30, 10d supply, fill #1
  Filled 2022-07-01: qty 30, 10d supply, fill #2

## 2021-08-09 NOTE — Patient Instructions (Addendum)
Refill Flexeril to take as needed. RTC in one year or as needed. It was a pleasure to see you today.  Labs are stable.  Continue current medications.

## 2021-08-09 NOTE — Progress Notes (Signed)
   Subjective:    Patient ID: Jacqueline Graham, female    DOB: [birth year], 60 y.o.   MRN: [id number]  HPI     Review of Systems     Objective:   Physical Exam        Assessment & Plan:

## 2021-08-09 NOTE — Progress Notes (Signed)
   Subjective:    Patient ID: Jacqueline Graham, female    DOB: Apr 16, 1961, 60 y.o.   MRN: [id number]  HPI 60 year old Female seen for health maintenance exam and evaluation of medical issues.  Her general health is excellent.  She has a history of hypothyroidism and hyperlipidemia.  GE reflux treated with Nexium.  History of allergic rhinitis treated with Flonase and Zyrtec.  Has glaucoma in both eyes.  History of migraine headaches.  Past medical history: Left breast fibroadenoma removed in the 1980s.  [place] Spotted Fever in 1971.  Social history: Non-smoker.  Social alcohol consumption.  Works as an Engineering geologist at [employer].  She is married.  No children.  2-year college degree.  Studies and excellent physical condition being active at work.  Family history: Father with history of stroke.  He had a pacemaker implanted with history of third-degree AV block with syncope.  Mother with history of dementia, kidney stones, acute renal failure after receiving IV contrast dye, hyperlipidemia, hypertension and hypothyroidism.  Mother has history of coronary artery disease status post stent placement.  Paternal grandmother with history of colon cancer.  Maternal grandmother with history of lymphoma.  3 brothers and 1 sister.  1 brother has diabetes, hypertension and kidney stones.    Review of Systems no new complaints-generally feels well     Objective:   Physical Exam Blood pressure 120/80 pulse 80 regular pulse oximetry 98% weight 113 pounds height 5 feet 0 inches BMI 22.07  Skin: Warm and dry.  No cervical adenopathy.  No thyromegaly.  No carotid bruits.  Chest clear to auscultation without rales or wheezing.  Cardiac exam: 1/6 systolic ejection murmur.  Regular rate and rhythm.  Abdomen soft nondistended without hepatosplenomegaly masses or tenderness.  No lower extremity pitting edema.  Affect thought and judgment are normal.  Neuro: No focal deficits on brief neurological  exam.       Assessment & Plan:  Normal health maintenance exam  History of hypothyroidism-stable with thyroid replacement medication.  TSH is normal on levothyroxine 50 mcg daily  History of glaucoma treated with Xalatan and HI  Hyperlipidemia treated with Zocor 20 mg daily and lipid panel is essentially normal except for a very slight elevation of LDL at 105.  History of benign microscopic hematuria  GE reflux treated with PPI  Plan: Continue current medications and return in 1 year or as needed. Patient had colonoscopy in 2015.  Tetanus immunization is up-to-date.  Mammogram done in April is within normal limits.  Flexeril refilled at patient request.

## 2021-08-10 LAB — URINALYSIS, MICROSCOPIC ONLY

## 2021-08-10 LAB — URINE CULTURE
MICRO NUMBER:: 12384888
Result:: NO GROWTH
SPECIMEN QUALITY:: ADEQUATE

## 2021-08-13 LAB — CYTOLOGY - PAP: Diagnosis: NEGATIVE

## 2021-08-19 ENCOUNTER — Other Ambulatory Visit (HOSPITAL_COMMUNITY): Payer: Self-pay

## 2021-08-21 ENCOUNTER — Other Ambulatory Visit: Payer: Self-pay | Admitting: Internal Medicine

## 2021-08-21 ENCOUNTER — Other Ambulatory Visit (HOSPITAL_COMMUNITY): Payer: Self-pay

## 2021-08-21 MED ORDER — LEVOTHYROXINE SODIUM 50 MCG PO TABS
50.0000 ug | ORAL_TABLET | Freq: Every day | ORAL | 2 refills | Status: DC
  Filled 2021-08-21: qty 90, 90d supply, fill #0
  Filled 2021-12-12: qty 90, 90d supply, fill #1
  Filled 2022-03-25: qty 90, 90d supply, fill #2

## 2021-10-08 ENCOUNTER — Other Ambulatory Visit (HOSPITAL_COMMUNITY): Payer: Self-pay

## 2021-11-04 ENCOUNTER — Other Ambulatory Visit (HOSPITAL_COMMUNITY): Payer: Self-pay

## 2021-11-19 ENCOUNTER — Other Ambulatory Visit (HOSPITAL_COMMUNITY): Payer: Self-pay

## 2021-11-19 MED FILL — Montelukast Sodium Tab 10 MG (Base Equiv): ORAL | 90 days supply | Qty: 90 | Fill #2 | Status: AC

## 2021-11-26 DIAGNOSIS — H5203 Hypermetropia, bilateral: Secondary | ICD-10-CM | POA: Diagnosis not present

## 2021-11-26 DIAGNOSIS — H401131 Primary open-angle glaucoma, bilateral, mild stage: Secondary | ICD-10-CM | POA: Diagnosis not present

## 2021-11-26 DIAGNOSIS — H2513 Age-related nuclear cataract, bilateral: Secondary | ICD-10-CM | POA: Diagnosis not present

## 2021-11-26 DIAGNOSIS — H524 Presbyopia: Secondary | ICD-10-CM | POA: Diagnosis not present

## 2021-12-12 ENCOUNTER — Other Ambulatory Visit (HOSPITAL_COMMUNITY): Payer: Self-pay

## 2022-01-02 ENCOUNTER — Other Ambulatory Visit (HOSPITAL_COMMUNITY): Payer: Self-pay

## 2022-01-20 IMAGING — MG MM DIGITAL SCREENING BILAT W/ TOMO AND CAD
8 series · 9 of 24 positions shown · non-contrast
Comparison: Previous exam(s).

CLINICAL DATA: Screening.

EXAM:
DIGITAL SCREENING BILATERAL MAMMOGRAM WITH TOMOSYNTHESIS AND CAD
TECHNIQUE: Bilateral screening digital craniocaudal and mediolateral oblique
mammograms were obtained. Bilateral screening digital breast
tomosynthesis was performed. The images were evaluated with
computer-aided detection.

[R CC synth-2D]
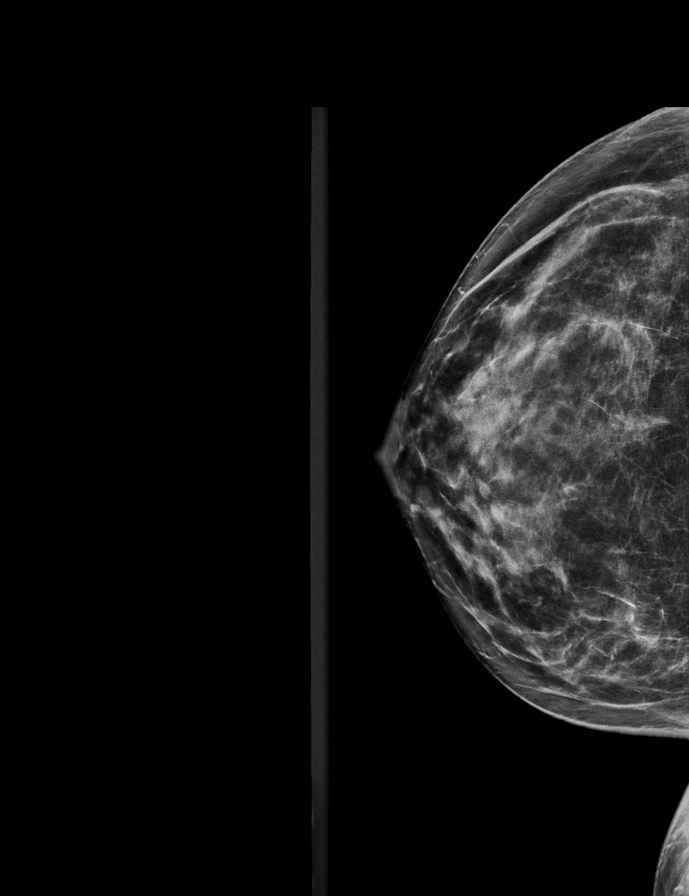

[R MLO synth-2D]
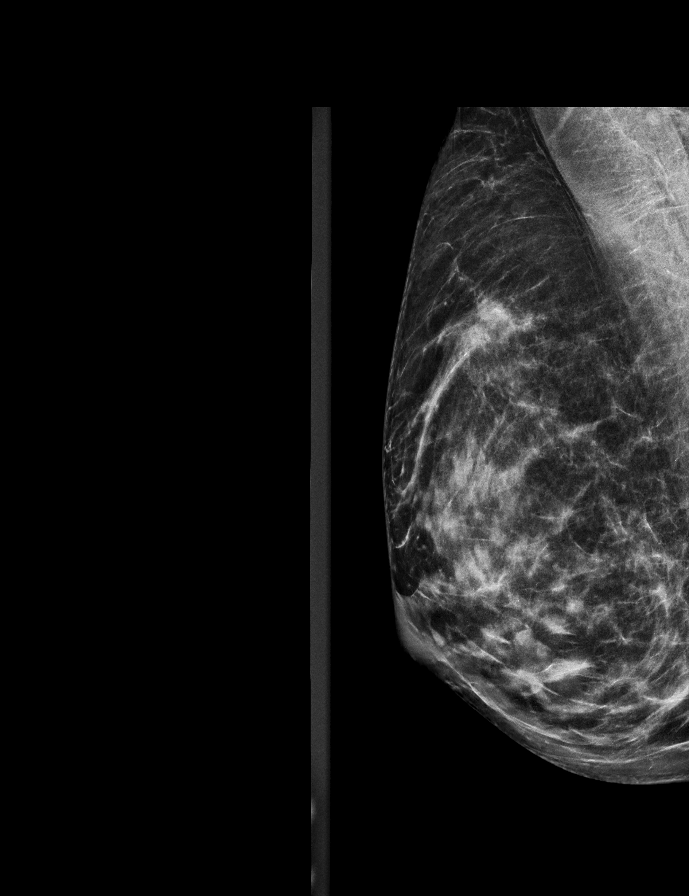

[L MLO synth-2D]
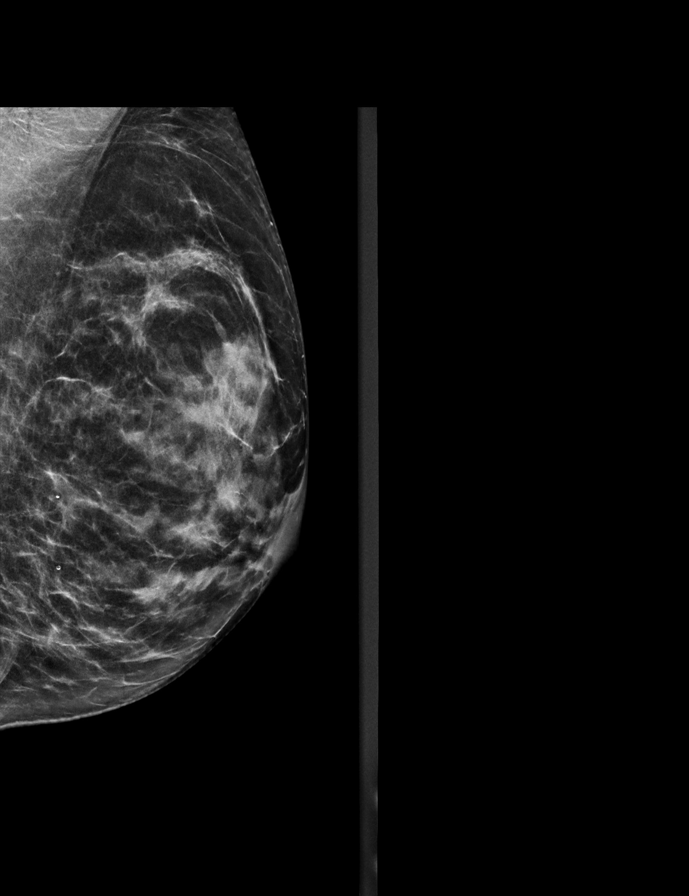

[L CC synth-2D]
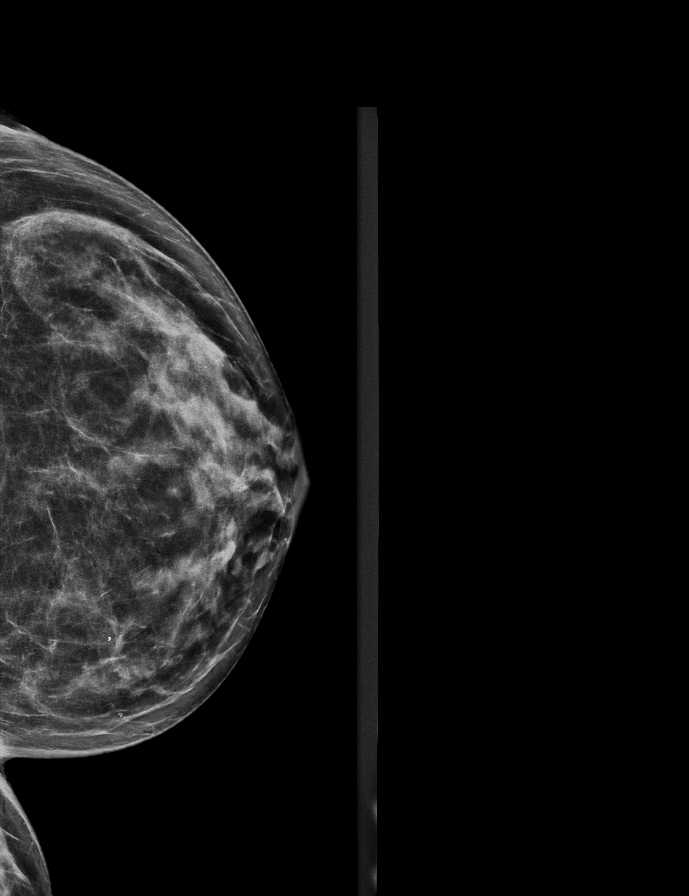

[R CC tomo · 2 of 63 frames shown]
[frame 21/63]
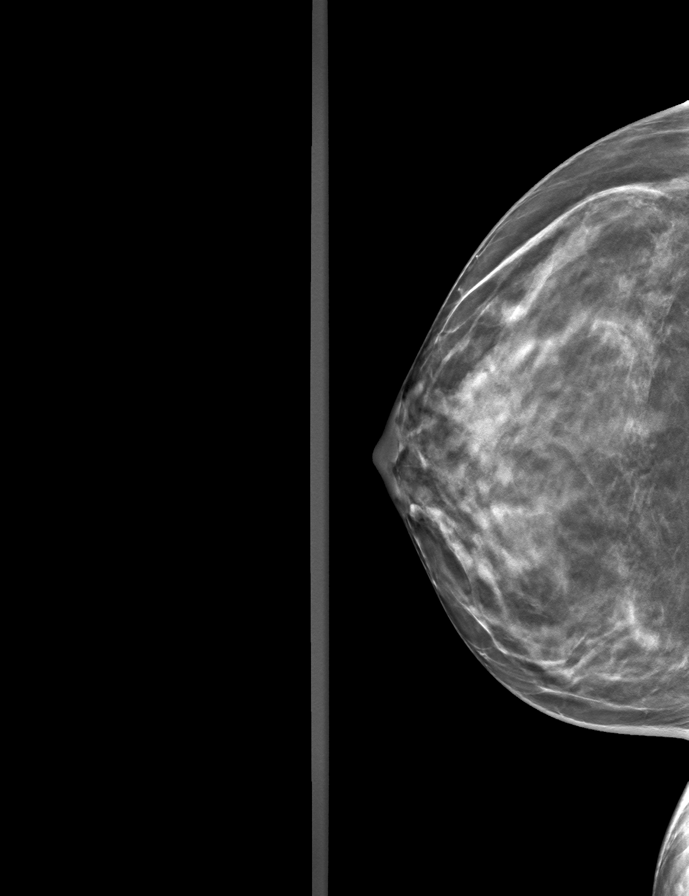
[frame 32/63]
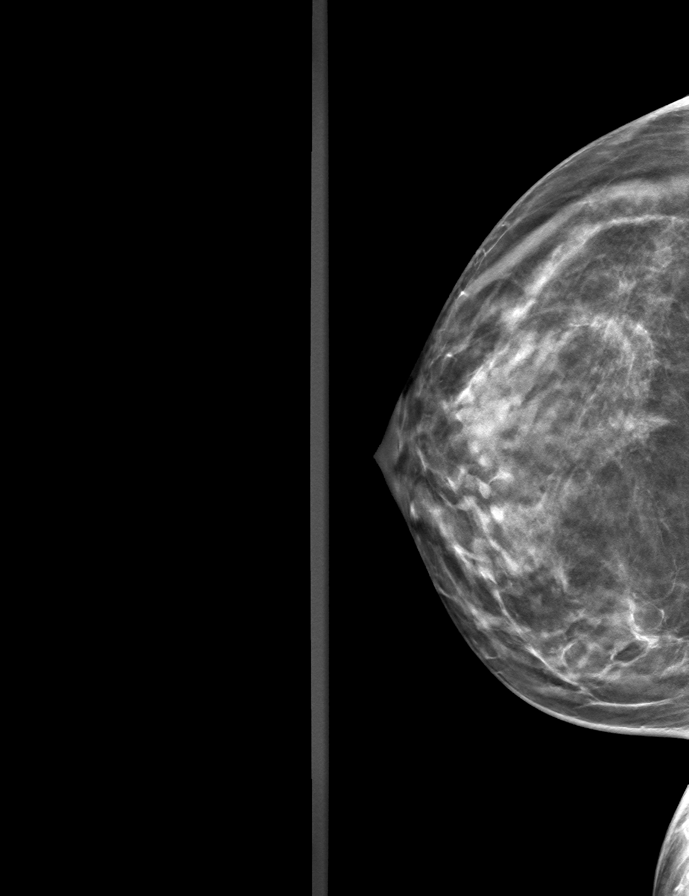

[L MLO tomo · tomo slice 31/60.0]
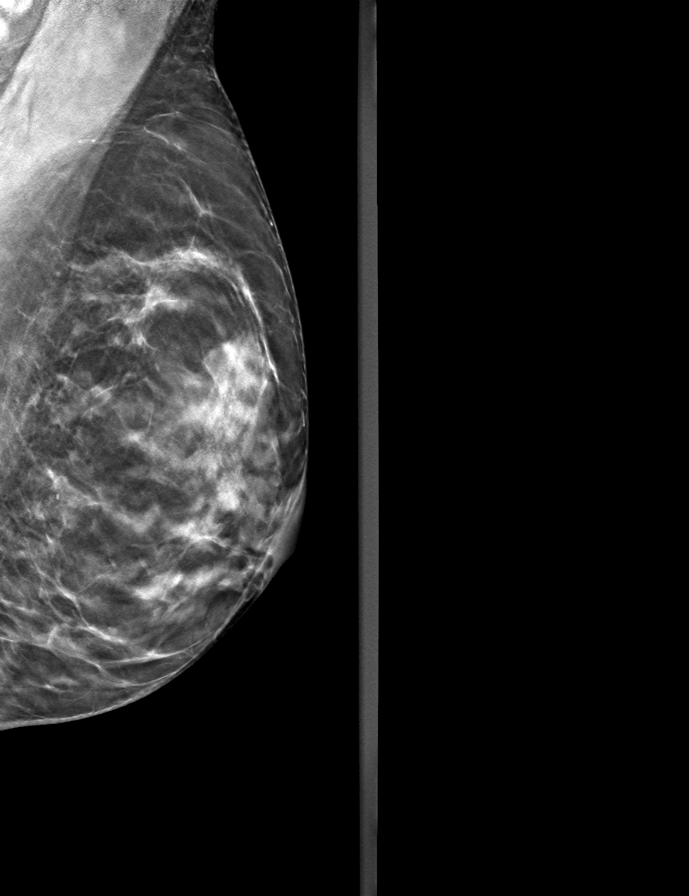

[L CC tomo · tomo slice 31/62.0]
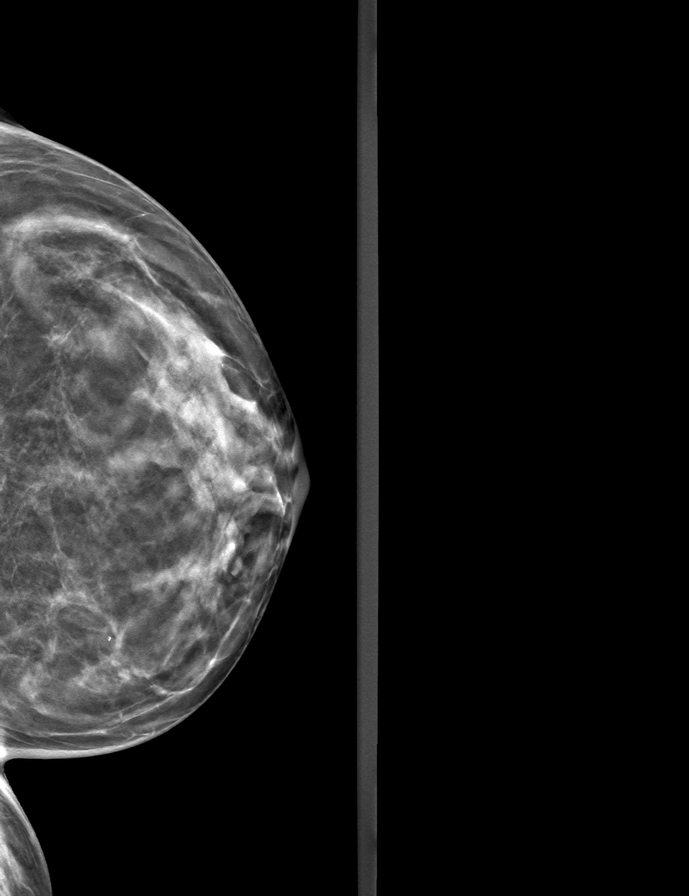

[R MLO tomo · tomo slice 31/60.0]
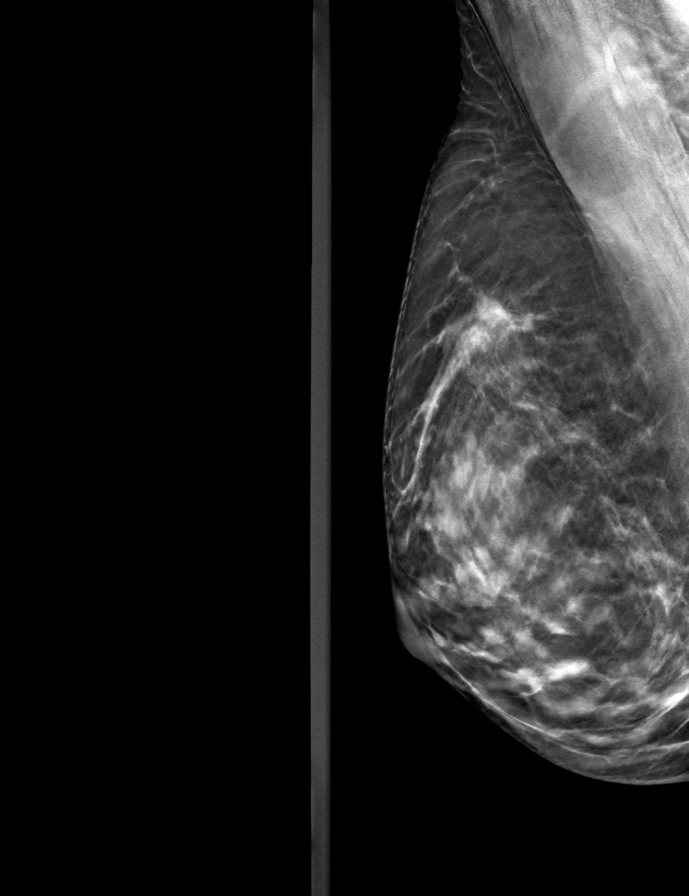

[9 of 24 positions shown; findings below may reference images not displayed]

ACR Breast Density Category c: The breast tissue is heterogeneously
dense, which may obscure small masses.
FINDINGS: There are no findings suspicious for malignancy. The images were
evaluated with computer-aided detection.
IMPRESSION: No mammographic evidence of malignancy. A result letter of this
screening mammogram will be mailed directly to the patient.

RECOMMENDATION:
Screening mammogram in one year. (Code:T4-5-GWO)

BI-RADS CATEGORY  1: Negative.

## 2022-02-20 ENCOUNTER — Other Ambulatory Visit (HOSPITAL_COMMUNITY): Payer: Self-pay

## 2022-02-20 MED ORDER — LATANOPROST 0.005 % OP SOLN
1.0000 [drp] | Freq: Every evening | OPHTHALMIC | 2 refills | Status: DC
  Filled 2022-02-20: qty 7.5, 75d supply, fill #0
  Filled 2022-07-01: qty 7.5, 75d supply, fill #1
  Filled 2022-10-17: qty 7.5, 75d supply, fill #2

## 2022-03-10 ENCOUNTER — Encounter: Payer: Self-pay | Admitting: Internal Medicine

## 2022-03-10 ENCOUNTER — Ambulatory Visit: Payer: 59 | Admitting: Internal Medicine

## 2022-03-10 VITALS — BP 108/70 | HR 64 | Temp 97.3°F | Ht 60.0 in | Wt 110.8 lb

## 2022-03-10 DIAGNOSIS — R682 Dry mouth, unspecified: Secondary | ICD-10-CM

## 2022-03-10 DIAGNOSIS — M2550 Pain in unspecified joint: Secondary | ICD-10-CM

## 2022-03-10 NOTE — Progress Notes (Signed)
? ?  Subjective:  ? ? Patient ID: Jacqueline Graham, female    DOB: [birth year], 61 y.o.   MRN: [id number] ? ?HPI 61 year old Female seen with c/o dry mouth. Nexium can cause dry mouth but has not been taking it regularly. Has tried to drink more fluids but mouth still feels dry.  When she chews gum,it is not noticeable to her. ? ? ?Family Hx mother and maternal grandmother  both had Rheumatoid Arthritis ? ?Review of Systems unlikely to be caused by Nexium as she does not take it every day. Sometimes takes Pepcid instead of Nexium. ? ?Flexeril can cause dry mouth. She took it daily for a while. Last took one or half or one about 2 days ago. ? ?It does look like that Xalatan for glaucoma can cause dry mouth and 3% of patients ? ? ?   ?Objective:  ? Physical Exam ? ?Blood pressure 108/70 pulse 64 temperature 97.3 degrees pulse oximetry 99% weight 110 pounds 12 ounces BMI 21.63 ? ?Examination of oral mucosa looks to be within normal limits. ? ? ?   ?Assessment & Plan:  ?Complaint of dry mouth.  Explained to her that sicca syndrome was unusual.  We did proceed with sed rate, rheumatoid factor ,CCP ,ANA and Hemoglobin A1c.  Also SSA Ro and SSA La antibodies were drawn and all of this proved to be within normal limits. ? ?She does have some arthralgias and we proceeded to rule out lupus and rheumatoid arthritis with rheumatology studies. ? ?Her hemoglobin A1c is normal. ? ?Addendum: It seems that Xalatan can cause dry mouth in 3% of patients.  I do not know if eye doctor can change her to another eyedrop for glaucoma but that is a possibility.  Otherwise she can just chew gum to help with dry mouth. ? ?

## 2022-03-14 ENCOUNTER — Other Ambulatory Visit: Payer: 59

## 2022-03-14 DIAGNOSIS — R682 Dry mouth, unspecified: Secondary | ICD-10-CM

## 2022-03-14 DIAGNOSIS — M2550 Pain in unspecified joint: Secondary | ICD-10-CM | POA: Diagnosis not present

## 2022-03-14 NOTE — Progress Notes (Signed)
Labs drawn according to order  

## 2022-03-17 LAB — HEMOGLOBIN A1C
Hgb A1c MFr Bld: 5.3 % of total Hgb (ref <5.7)
Mean Plasma Glucose: 105 mg/dL
eAG (mmol/L): 5.8 mmol/L

## 2022-03-17 LAB — CYCLIC CITRUL PEPTIDE ANTIBODY, IGG: Cyclic Citrullin Peptide Ab: <16 UNITS

## 2022-03-17 LAB — RHEUMATOID FACTOR: Rheumatoid fact SerPl-aCnc: <14 IU/mL (ref <14)

## 2022-03-17 LAB — SEDIMENTATION RATE: Sed Rate: 2 mm/h (ref 0–30)

## 2022-03-17 LAB — SJOGRENS SYNDROME-A EXTRACTABLE NUCLEAR ANTIBODY: SSA (Ro) (ENA) Antibody, IgG: <1 AI

## 2022-03-17 LAB — SJOGRENS SYNDROME-B EXTRACTABLE NUCLEAR ANTIBODY: SSB (La) (ENA) Antibody, IgG: <1 AI

## 2022-03-17 LAB — ANA: Anti Nuclear Antibody (ANA): NEGATIVE

## 2022-03-18 ENCOUNTER — Encounter: Payer: Self-pay | Admitting: Internal Medicine

## 2022-03-18 NOTE — Patient Instructions (Signed)
The only medicine that I think that could cause her symptoms at this time is Xalatan.  Work-up for Sjogren's syndrome is negative.  Hemoglobin A1c is normal.  Eye physician may need to change her eyedrop if her symptoms are really bothering her.  Otherwise she may just need to chew gum. ?

## 2022-03-25 ENCOUNTER — Other Ambulatory Visit (HOSPITAL_COMMUNITY): Payer: Self-pay

## 2022-04-18 ENCOUNTER — Other Ambulatory Visit: Payer: Self-pay | Admitting: Internal Medicine

## 2022-04-18 ENCOUNTER — Other Ambulatory Visit (HOSPITAL_COMMUNITY): Payer: Self-pay

## 2022-04-18 MED ORDER — SIMVASTATIN 20 MG PO TABS
20.0000 mg | ORAL_TABLET | Freq: Every day | ORAL | 3 refills | Status: DC
  Filled 2022-04-18: qty 90, 90d supply, fill #0
  Filled 2022-07-17 – 2022-07-29 (×2): qty 90, 90d supply, fill #1
  Filled 2022-10-24: qty 90, 90d supply, fill #2
  Filled 2023-02-02: qty 90, 90d supply, fill #3

## 2022-04-18 MED ORDER — MONTELUKAST SODIUM 10 MG PO TABS
10.0000 mg | ORAL_TABLET | Freq: Every day | ORAL | 3 refills | Status: DC
  Filled 2022-04-18: qty 90, 90d supply, fill #0
  Filled 2023-02-02: qty 90, 90d supply, fill #1

## 2022-06-03 DIAGNOSIS — H401131 Primary open-angle glaucoma, bilateral, mild stage: Secondary | ICD-10-CM | POA: Diagnosis not present

## 2022-06-26 ENCOUNTER — Other Ambulatory Visit: Payer: Self-pay | Admitting: Internal Medicine

## 2022-06-26 DIAGNOSIS — Z1231 Encounter for screening mammogram for malignant neoplasm of breast: Secondary | ICD-10-CM

## 2022-07-01 ENCOUNTER — Other Ambulatory Visit: Payer: Self-pay | Admitting: Internal Medicine

## 2022-07-01 ENCOUNTER — Other Ambulatory Visit (HOSPITAL_COMMUNITY): Payer: Self-pay

## 2022-07-01 MED ORDER — LEVOTHYROXINE SODIUM 50 MCG PO TABS
50.0000 ug | ORAL_TABLET | Freq: Every day | ORAL | 2 refills | Status: DC
  Filled 2022-07-01: qty 90, 90d supply, fill #0
  Filled 2022-10-15: qty 90, 90d supply, fill #1
  Filled 2023-02-02: qty 90, 90d supply, fill #2

## 2022-07-02 NOTE — Telephone Encounter (Signed)
This message was sent via [place], a product from [employer] http://www.[employer].com/                    -------Fax Transmission Report-------  To:               Recipient at [phone] Subject:          FW: Hp Scans Result:           The transmission was successful. Explanation:      All Pages Ok Pages Sent:       2 Connect Time:     1 minutes, 25 seconds Transmit Time:    07/01/2022 16:13 Transfer Rate:    14400 Status Code:      0000 Retry Count:      0 Job Id:           [id number] Unique Id:        [id number] Fax Line:         38 Fax Server:       [employer]

## 2022-07-02 NOTE — Telephone Encounter (Signed)
Faxed signed form with approval to change manufacture of medication.

## 2022-07-16 ENCOUNTER — Ambulatory Visit
Admission: RE | Admit: 2022-07-16 | Discharge: 2022-07-16 | Disposition: A | Discharge status: Home / Self Care | Payer: 59 | Source: Ambulatory Visit | Attending: Internal Medicine | Admitting: Internal Medicine

## 2022-07-16 DIAGNOSIS — Z1231 Encounter for screening mammogram for malignant neoplasm of breast: Secondary | ICD-10-CM | POA: Diagnosis not present

## 2022-07-17 ENCOUNTER — Other Ambulatory Visit (HOSPITAL_COMMUNITY): Payer: Self-pay

## 2022-07-25 ENCOUNTER — Other Ambulatory Visit (HOSPITAL_COMMUNITY): Payer: Self-pay

## 2022-07-29 ENCOUNTER — Other Ambulatory Visit (HOSPITAL_COMMUNITY): Payer: Self-pay

## 2022-08-12 ENCOUNTER — Other Ambulatory Visit: Payer: 59

## 2022-08-12 DIAGNOSIS — E785 Hyperlipidemia, unspecified: Secondary | ICD-10-CM

## 2022-08-12 DIAGNOSIS — Z8669 Personal history of other diseases of the nervous system and sense organs: Secondary | ICD-10-CM | POA: Diagnosis not present

## 2022-08-12 DIAGNOSIS — E039 Hypothyroidism, unspecified: Secondary | ICD-10-CM

## 2022-08-13 LAB — CBC WITH DIFFERENTIAL/PLATELET
Absolute Monocytes: 409 cells/uL (ref 200–950)
Basophils Absolute: 28 cells/uL (ref 0–200)
Basophils Relative: 0.6 %
Eosinophils Absolute: 71 cells/uL (ref 15–500)
Eosinophils Relative: 1.5 %
HCT: 37.3 % (ref 35.0–45.0)
Hemoglobin: 12.6 g/dL (ref 11.7–15.5)
Lymphs Abs: 1142 cells/uL (ref 850–3900)
MCH: 31.7 pg (ref 27.0–33.0)
MCHC: 33.8 g/dL (ref 32.0–36.0)
MCV: 93.7 fL (ref 80.0–100.0)
MPV: 9.5 fL (ref 7.5–12.5)
Monocytes Relative: 8.7 %
Neutro Abs: 3050 cells/uL (ref 1500–7800)
Neutrophils Relative %: 64.9 %
Platelets: 244 10*3/uL (ref 140–400)
RBC: 3.98 10*6/uL (ref 3.80–5.10)
RDW: 12.1 % (ref 11.0–15.0)
Total Lymphocyte: 24.3 %
WBC: 4.7 10*3/uL (ref 3.8–10.8)

## 2022-08-13 LAB — COMPLETE METABOLIC PANEL WITH GFR
AG Ratio: 1.9 (calc) (ref 1.0–2.5)
ALT: 15 U/L (ref 6–29)
AST: 23 U/L (ref 10–35)
Albumin: 4.5 g/dL (ref 3.6–5.1)
Alkaline phosphatase (APISO): 60 U/L (ref 37–153)
BUN: 15 mg/dL (ref 7–25)
CO2: 27 mmol/L (ref 20–32)
Calcium: 10.2 mg/dL (ref 8.6–10.4)
Chloride: 104 mmol/L (ref 98–110)
Creat: 0.96 mg/dL (ref 0.50–1.03)
Globulin: 2.4 g/dL (calc) (ref 1.9–3.7)
Glucose, Bld: 82 mg/dL (ref 65–99)
Potassium: 5.5 mmol/L — ABNORMAL HIGH (ref 3.5–5.3)
Sodium: 141 mmol/L (ref 135–146)
Total Bilirubin: 0.5 mg/dL (ref 0.2–1.2)
Total Protein: 6.9 g/dL (ref 6.1–8.1)
eGFR: 68 mL/min/{1.73_m2} (ref >=60)

## 2022-08-13 LAB — LIPID PANEL
Cholesterol: 175 mg/dL (ref <200)
HDL: 74 mg/dL (ref >=50)
LDL Cholesterol (Calc): 87 mg/dL (calc)
Non-HDL Cholesterol (Calc): 101 mg/dL (calc) (ref <130)
Total CHOL/HDL Ratio: 2.4 (calc) (ref <5.0)
Triglycerides: 59 mg/dL (ref <150)

## 2022-08-13 LAB — TSH: TSH: 2.5 mIU/L (ref 0.40–4.50)

## 2022-08-15 ENCOUNTER — Ambulatory Visit (INDEPENDENT_AMBULATORY_CARE_PROVIDER_SITE_OTHER): Payer: 59 | Admitting: Internal Medicine

## 2022-08-15 ENCOUNTER — Encounter: Payer: Self-pay | Admitting: Internal Medicine

## 2022-08-15 ENCOUNTER — Other Ambulatory Visit (HOSPITAL_COMMUNITY): Payer: Self-pay

## 2022-08-15 VITALS — BP 110/74 | HR 66 | Temp 98.1°F | Ht 60.0 in | Wt 110.0 lb

## 2022-08-15 DIAGNOSIS — Z8669 Personal history of other diseases of the nervous system and sense organs: Secondary | ICD-10-CM

## 2022-08-15 DIAGNOSIS — E039 Hypothyroidism, unspecified: Secondary | ICD-10-CM | POA: Diagnosis not present

## 2022-08-15 DIAGNOSIS — Z8709 Personal history of other diseases of the respiratory system: Secondary | ICD-10-CM | POA: Diagnosis not present

## 2022-08-15 DIAGNOSIS — Z0000 Encounter for general adult medical examination without abnormal findings: Secondary | ICD-10-CM

## 2022-08-15 DIAGNOSIS — R319 Hematuria, unspecified: Secondary | ICD-10-CM

## 2022-08-15 DIAGNOSIS — K219 Gastro-esophageal reflux disease without esophagitis: Secondary | ICD-10-CM

## 2022-08-15 LAB — POCT URINALYSIS DIPSTICK
Bilirubin, UA: NEGATIVE
Glucose, UA: NEGATIVE
Ketones, UA: NEGATIVE
Leukocytes, UA: NEGATIVE
Nitrite, UA: NEGATIVE
Protein, UA: NEGATIVE
Spec Grav, UA: 1.01 (ref 1.010–1.025)
Urobilinogen, UA: 0.2 E.U./dL
pH, UA: 5 (ref 5.0–8.0)

## 2022-08-15 MED ORDER — METHOCARBAMOL 500 MG PO TABS
500.0000 mg | ORAL_TABLET | Freq: Three times a day (TID) | ORAL | 1 refills | Status: DC | PRN
  Filled 2022-08-15: qty 30, 10d supply, fill #0

## 2022-08-15 NOTE — Progress Notes (Signed)
   Subjective:    Patient ID: Jacqueline Graham, female    DOB: 20-Dec-1960, 61 y.o.   MRN: [id number]  HPI 61 year old Female seen for health maintenance exam and evaluation of medical issues.  Her general health is excellent.  She has a history of hypothyroidism and hyperlipidemia.  Has GE reflux treated with Nexium.  History of allergic rhinitis treated with Flonase and Zyrtec.  Has glaucoma in both eyes.  History of migraine headaches.  Past medical history: Left breast fibroadenoma removed in the 1980s.  History of [facility] spotted fever in 1971.  Social history: Non-smoker.  Social alcohol consumption.  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  No children.  2-year college degree.  She stays in excellent physical condition being active at work.  Family history: Father with history of stroke.  He had a pacemaker implanted with history of third-degree AV block with syncope.  Mother with history of dementia, kidney stones, acute renal failure after receiving IV contrast dye, hyperlipidemia, hypertension and hypothyroidism.  Mother has history of coronary artery disease status post stent placement.  Paternal grandmother with history of colon cancer.  Maternal grandmother with history of lymphoma.  3 brothers and 1 sister.  1 brother has diabetes, hypertension and kidney stones.    Review of Systems feels well with no new complaints.     Objective:   Physical Exam Blood pressure 110/74, pulse 66 regular, temperature 98.1 degrees pulse oximetry 98% weight 110 pounds height 5 feet 0 inches BMI 21.48 Skin: Warm and dry.  No cervical adenopathy.  No thyromegaly.  No carotid bruits.  Chest is clear to auscultation without rales or wheezing.  Cardiac exam: Regular rate and rhythm without murmur.  No lower extremity pitting edema.  Neurological exam is intact without gross focal deficits.  Affect thought and judgment are normal.      Assessment & Plan:  Normal health  maintenance exam  Hypothyroidism treated with thyroid replacement medication and TSH is stable at 2.50.  She feels well on this dose.  Current dose is 50 mcg daily.  She is also on simvastatin 20 mg daily and lipid panel is normal.  Liver functions are also normal.  History of glaucoma treated with Xalatan eyedrops  Allergic rhinitis treated with Singulair and Flonase nasal spray  GE reflux treated with Nexium 20 mg as needed  Plan: Mammogram was normal in August 2023.  Patient had colonoscopy in 2015 which was normal.  Tetanus vaccine is up-to-date.  Received flu vaccine 08/15/2022.  Has had 2 COVID vaccines.  May consider RSV vaccine and pneumococcal 20 vaccine as well as COVID booster if she so desires.  Return in 1 year or as needed.  Addendum: Urine dipstick for some reason had occult blood on it and culture was sent as well as urine microscopic.  Culture was negative and she had 0-2 red blood cells per high-powered field.  I think the urine dipstick result is an error.

## 2022-08-16 LAB — URINALYSIS, MICROSCOPIC ONLY
Bacteria, UA: NONE SEEN /HPF
Hyaline Cast: NONE SEEN /LPF
RBC / HPF: NONE SEEN /HPF (ref 0–2)
Squamous Epithelial / HPF: NONE SEEN /HPF (ref <=5)
WBC, UA: NONE SEEN /HPF (ref 0–5)

## 2022-08-16 LAB — URINE CULTURE
MICRO NUMBER:: 13955995
Result:: NO GROWTH
SPECIMEN QUALITY:: ADEQUATE

## 2022-08-20 NOTE — Patient Instructions (Addendum)
It was a pleasure to see you today.  Labs are stable and within normal limits.  Vaccines discussed.  Continue current medications and return in 1 year or as needed.  Colonoscopy is up-to-date.  Mammogram is up-to-date.

## 2022-10-15 ENCOUNTER — Other Ambulatory Visit (HOSPITAL_COMMUNITY): Payer: Self-pay

## 2022-10-17 ENCOUNTER — Other Ambulatory Visit (HOSPITAL_COMMUNITY): Payer: Self-pay

## 2022-10-24 ENCOUNTER — Other Ambulatory Visit (HOSPITAL_COMMUNITY): Payer: Self-pay

## 2022-12-04 DIAGNOSIS — H5203 Hypermetropia, bilateral: Secondary | ICD-10-CM | POA: Diagnosis not present

## 2022-12-04 DIAGNOSIS — H401131 Primary open-angle glaucoma, bilateral, mild stage: Secondary | ICD-10-CM | POA: Diagnosis not present

## 2022-12-04 DIAGNOSIS — H524 Presbyopia: Secondary | ICD-10-CM | POA: Diagnosis not present

## 2022-12-04 DIAGNOSIS — H2513 Age-related nuclear cataract, bilateral: Secondary | ICD-10-CM | POA: Diagnosis not present

## 2023-03-20 ENCOUNTER — Other Ambulatory Visit (HOSPITAL_COMMUNITY): Payer: Self-pay

## 2023-03-20 MED ORDER — LATANOPROST 0.005 % OP SOLN
1.0000 [drp] | Freq: Every day | OPHTHALMIC | 2 refills | Status: AC
  Filled 2023-03-20: qty 7.5, 75d supply, fill #0
  Filled 2023-07-16: qty 7.5, 75d supply, fill #1
  Filled 2023-11-02: qty 7.5, 75d supply, fill #2

## 2023-05-18 ENCOUNTER — Other Ambulatory Visit: Payer: Self-pay | Admitting: Internal Medicine

## 2023-05-18 ENCOUNTER — Other Ambulatory Visit (HOSPITAL_COMMUNITY): Payer: Self-pay

## 2023-05-18 MED ORDER — SIMVASTATIN 20 MG PO TABS
20.0000 mg | ORAL_TABLET | Freq: Every day | ORAL | 3 refills | Status: DC
  Filled 2023-05-18: qty 90, 90d supply, fill #0
  Filled 2023-08-26: qty 90, 90d supply, fill #1
  Filled 2023-12-02: qty 90, 90d supply, fill #2
  Filled 2024-03-14: qty 90, 90d supply, fill #3

## 2023-05-18 MED ORDER — LEVOTHYROXINE SODIUM 50 MCG PO TABS
50.0000 ug | ORAL_TABLET | Freq: Every day | ORAL | 2 refills | Status: DC
  Filled 2023-05-18: qty 90, 90d supply, fill #0
  Filled 2023-08-26: qty 90, 90d supply, fill #1
  Filled 2023-12-02: qty 90, 90d supply, fill #2

## 2023-06-16 ENCOUNTER — Telehealth: Payer: Self-pay | Admitting: Internal Medicine

## 2023-06-16 NOTE — Telephone Encounter (Signed)
Patient has called stating she is still having some GI problems. Wanting to finally see Dr.Mann in the gastro department. Will patient need an appointment or can that referral be made? Please advise

## 2023-06-18 ENCOUNTER — Other Ambulatory Visit (HOSPITAL_COMMUNITY): Payer: Self-pay

## 2023-06-18 ENCOUNTER — Encounter: Payer: Self-pay | Admitting: Internal Medicine

## 2023-06-18 ENCOUNTER — Ambulatory Visit (INDEPENDENT_AMBULATORY_CARE_PROVIDER_SITE_OTHER): Payer: 59 | Admitting: Internal Medicine

## 2023-06-18 VITALS — BP 108/70 | HR 69 | Resp 16 | Ht 60.0 in | Wt 111.5 lb

## 2023-06-18 DIAGNOSIS — K6289 Other specified diseases of anus and rectum: Secondary | ICD-10-CM

## 2023-06-18 MED ORDER — HYDROCORTISONE ACE-PRAMOXINE 1-1 % EX CREA
1.0000 | TOPICAL_CREAM | Freq: Two times a day (BID) | CUTANEOUS | 0 refills | Status: DC
  Filled 2023-06-18: qty 30, 30d supply, fill #0

## 2023-06-18 NOTE — Progress Notes (Signed)
Patient Care Team: Margaree Mackintosh, MD as PCP - General (Internal Medicine)  Visit Date: 06/18/23  Subjective:    Patient ID: Jacqueline Graham , Female   DOB: [birth year], 62 y.o.    MRN: [id number]   62 y.o. Female presents today for follow up for GI issues.   In 12/2022, both her and her husband reportedly had a GI bug with diarrhea but no vomiting. Her husband recovered, but she continued to suffer from unpredictable diarrhea several times a day for 1.5 months. No hematochezia or melena, no weight loss. Eventually she was taking Imodium and a probiotic and her symptoms began to subside. She was drinking lots of water and avoiding caffeine. At this time, she confirms that her diarrhea has resolved and she is still taking the probiotics. May have bowel movements 1-2 times a day, but no diarrhea. Very occasional constipation. No straining.   In the last couple of weeks she has new onset of sharp rectal pain. This pain is occurring randomly and is not typically constant. Her longest episode of rectal pain was this past Friday lasting all day. She has woken up in the middle of the night with the rectal pain.  Has GE reflux treated with Nexium.   Never diagnosed with IBS.   Her last colonoscopy was completed 11/13/2014 and was normal. Recommended 10-year repeat colonoscopy.  She is requesting a referral to gastroenterology with Dr. Loreta Ave.    Past Medical History:  Diagnosis Date   Hyperlipidemia      Family History  Problem Relation Age of Onset   Diabetes Mother    Hypertension Mother    Heart disease Mother    Hyperlipidemia Mother    Heart disease Father    Stroke Father     Social History   Social History Narrative   Not on file      Review of Systems  Constitutional:  Negative for fever and malaise/fatigue.  HENT:  Negative for congestion.   Eyes:  Negative for blurred vision.  Respiratory:  Negative for cough and shortness of breath.   Cardiovascular:  Negative  for chest pain, palpitations and leg swelling.  Gastrointestinal:  Positive for constipation (Very occasional). Negative for blood in stool, diarrhea, melena and vomiting.       (+) Rectal pain  Musculoskeletal:  Negative for back pain.  Skin:  Negative for rash.  Neurological:  Negative for loss of consciousness and headaches.        Objective:   Vitals: BP 108/70   Pulse 69   Resp 16   Ht 5' (1.524 m)   Wt 111 lb 8 oz (50.6 kg)   SpO2 98%   BMI 21.78 kg/m    Physical Exam Vitals and nursing note reviewed. Exam conducted with a chaperone present.  Constitutional:      General: She is not in acute distress.    Appearance: Normal appearance. She is not toxic-appearing.  HENT:     Head: Normocephalic and atraumatic.  Genitourinary:    Rectum: No mass or anal fissure.     Comments: Rectal exam performed today. Slight erythema at the anus. No masses palpated, no fissures. No stool to guaiac.  Skin:    General: Skin is warm and dry.  Neurological:     Mental Status: She is alert and oriented to person, place, and time. Mental status is at baseline.  Psychiatric:        Mood and Affect: Mood normal.  Behavior: Behavior normal.        Thought Content: Thought content normal.        Judgment: Judgment normal.       Results:   Studies obtained and personally reviewed by me:  Her last colonoscopy was completed 11/13/2014 and was normal. Recommended 10-year repeat colonoscopy.  Labs:       Component Value Date/Time   NA 141 08/12/2022 0942   K 5.5 (H) 08/12/2022 0942   CL 104 08/12/2022 0942   CO2 27 08/12/2022 0942   GLUCOSE 82 08/12/2022 0942   BUN 15 08/12/2022 0942   CREATININE 0.96 08/12/2022 0942   CALCIUM 10.2 08/12/2022 0942   PROT 6.9 08/12/2022 0942   ALBUMIN 4.5 02/10/2017 1142   AST 23 08/12/2022 0942   ALT 15 08/12/2022 0942   ALKPHOS 58 02/10/2017 1142   BILITOT 0.5 08/12/2022 0942   GFRNONAA 61 04/30/2020 1002   GFRAA 71 04/30/2020 1002      Lab Results  Component Value Date   WBC 4.7 08/12/2022   HGB 12.6 08/12/2022   HCT 37.3 08/12/2022   MCV 93.7 08/12/2022   PLT 244 08/12/2022    Lab Results  Component Value Date   CHOL 175 08/12/2022   HDL 74 08/12/2022   LDLCALC 87 08/12/2022   TRIG 59 08/12/2022   CHOLHDL 2.4 08/12/2022    Lab Results  Component Value Date   HGBA1C 5.3 03/14/2022     Lab Results  Component Value Date   TSH 2.50 08/12/2022     Assessment & Plan:   Rectal Pain / ? Proctitis - New, sharp rectal pain for the past couple weeks. Diarrhea has resolved. Rectal exam was performed today; slight erythema at the anus. No stool to guaiac. We will refer her to gastroenterology with Dr. Charna Elizabeth for further evaluation. Will also prescribe Proctocream-HC 1-1% to use 2-3 times a day as needed. Anoscopy not done.  GE reflux treated with Nexium 20 mg as needed.     I,Mathew Stumpf,acting as a Neurosurgeon for Margaree Mackintosh, MD.,have documented all relevant documentation on the behalf of Margaree Mackintosh, MD,as directed by  Margaree Mackintosh, MD while in the presence of Margaree Mackintosh, MD.   I, Margaree Mackintosh, MD, have reviewed all documentation for this visit. The documentation on 06/19/23 for the exam, diagnosis, procedures, and orders are all accurate and complete.

## 2023-06-19 NOTE — Patient Instructions (Addendum)
Referral to patient's GYN Dr. Charna Elizabeth for evaluation of rectal discomfort / possible proctitis. Use Proctocream HC as directed.

## 2023-06-25 DIAGNOSIS — Z800 Family history of malignant neoplasm of digestive organs: Secondary | ICD-10-CM | POA: Diagnosis not present

## 2023-06-25 DIAGNOSIS — K5900 Constipation, unspecified: Secondary | ICD-10-CM | POA: Diagnosis not present

## 2023-06-25 DIAGNOSIS — E782 Mixed hyperlipidemia: Secondary | ICD-10-CM | POA: Diagnosis not present

## 2023-06-25 DIAGNOSIS — E039 Hypothyroidism, unspecified: Secondary | ICD-10-CM | POA: Diagnosis not present

## 2023-06-25 DIAGNOSIS — Z1211 Encounter for screening for malignant neoplasm of colon: Secondary | ICD-10-CM | POA: Diagnosis not present

## 2023-06-25 DIAGNOSIS — K600 Acute anal fissure: Secondary | ICD-10-CM | POA: Diagnosis not present

## 2023-07-29 ENCOUNTER — Other Ambulatory Visit (HOSPITAL_COMMUNITY): Payer: Self-pay

## 2023-07-29 MED ORDER — GOLYTELY 236 G PO SOLR
ORAL | 0 refills | Status: DC
  Filled 2023-07-29: qty 4000, 1d supply, fill #0

## 2023-07-30 ENCOUNTER — Other Ambulatory Visit (HOSPITAL_COMMUNITY): Payer: Self-pay

## 2023-08-05 ENCOUNTER — Other Ambulatory Visit: Payer: Self-pay | Admitting: Internal Medicine

## 2023-08-05 DIAGNOSIS — Z1231 Encounter for screening mammogram for malignant neoplasm of breast: Secondary | ICD-10-CM

## 2023-08-10 DIAGNOSIS — Z1211 Encounter for screening for malignant neoplasm of colon: Secondary | ICD-10-CM | POA: Diagnosis not present

## 2023-08-10 DIAGNOSIS — K635 Polyp of colon: Secondary | ICD-10-CM | POA: Diagnosis not present

## 2023-08-10 DIAGNOSIS — D124 Benign neoplasm of descending colon: Secondary | ICD-10-CM | POA: Diagnosis not present

## 2023-08-10 DIAGNOSIS — Z800 Family history of malignant neoplasm of digestive organs: Secondary | ICD-10-CM | POA: Diagnosis not present

## 2023-08-10 LAB — HM COLONOSCOPY

## 2023-08-13 NOTE — Progress Notes (Signed)
Patient Care Team: Margaree Mackintosh, MD as PCP - General (Internal Medicine)  Visit Date: 08/20/23  Subjective:    Patient ID: Jacqueline Graham , Female   DOB: 13-Feb-1961, 62 y.o.    MRN: [id number]   62 y.o. Female presents today for annual comprehensive physical exam. History of hyperlipidemia, hypothyroidism, GERD.  She has been having pain in her hands that radiates into her forearms. This has been ongoing for years. Reports NSAID's upset her stomach. She has some mild numbness in her hands.  History of hyperlipidemia treated with simvastatin 20 mg daily. Lipid panel normal.  History of hypothyroidism treated with levothyroxine 50 mcg daily. TSH normal at 3.66.  History of GERD treated with esomeprazole 20 mg as needed.  Her general health is excellent. History of allergic rhinitis treated with Flonase, Zyrtec, montelukast.  Has glaucoma in both eyes.  History of migraine headaches.   Past medical history: Left breast fibroadenoma removed in the 1980s.  History of [facility] spotted fever in 1971.  Labs reviewed today. Kidney, liver functions normal. Electrolytes normal. Blood proteins normal. CBC normal.   Pap smear last completed 08/09/21. Negative for intraepithelial lesion or malignancy.   Mammogram was normal in August 2023.   Colonoscopy last completed 08/10/23. Showed one 8 mm sessile polyp in proximal descending colon. Repeat recommended in 2029.   Social history: Non-smoker.  Social alcohol consumption.  Works as an Dentist at [employer].  She is married.  No children.  2-year college degree.  She stays in excellent physical condition being active at work.   Family history: Father with history of stroke.  He had a pacemaker implanted with history of third-degree AV block with syncope.  Mother with history of dementia, kidney stones, acute renal failure after receiving IV contrast dye, hyperlipidemia, hypertension and hypothyroidism.  Mother has history of  coronary artery disease status post stent placement.  Paternal grandmother with history of colon cancer.  Maternal grandmother with history of lymphoma.  3 brothers and 1 sister.  1 brother has diabetes, hypertension and kidney stones.  Past Medical History:  Diagnosis Date   Hyperlipidemia      Family History  Problem Relation Age of Onset   Diabetes Mother    Hypertension Mother    Heart disease Mother    Hyperlipidemia Mother    Heart disease Father    Stroke Father     Social Hx: Married. Works as a Building control surveyor at [employer]. Nonsmoker     Review of Systems  Constitutional:  Negative for chills, fever, malaise/fatigue and weight loss.  HENT:  Negative for hearing loss, sinus pain and sore throat.   Respiratory:  Negative for cough, hemoptysis and shortness of breath.   Cardiovascular:  Negative for chest pain, palpitations, leg swelling and PND.  Gastrointestinal:  Negative for abdominal pain, constipation, diarrhea, heartburn, nausea and vomiting.  Genitourinary:  Negative for dysuria, frequency and urgency.  Musculoskeletal:  Positive for joint pain (Hands) and myalgias (Forearms). Negative for back pain and neck pain.  Skin:  Negative for itching and rash.  Neurological:  Negative for dizziness, tingling, seizures and headaches.       (+) Numbness hands  Endo/Heme/Allergies:  Negative for polydipsia.  Psychiatric/Behavioral:  Negative for depression. The patient is not nervous/anxious.         Objective:   Vitals: BP 110/80   Pulse 70   Ht 5' 0.25" (1.53 m)   Wt 111 lb (50.3 kg)  SpO2 97%   BMI 21.50 kg/m    Physical Exam Vitals and nursing note reviewed.  Constitutional:      General: She is not in acute distress.    Appearance: Normal appearance. She is not ill-appearing or toxic-appearing.  HENT:     Head: Normocephalic and atraumatic.     Right Ear: Hearing, tympanic membrane, ear canal and external ear normal.     Left Ear: Hearing, tympanic  membrane, ear canal and external ear normal.     Mouth/Throat:     Pharynx: Oropharynx is clear.  Eyes:     Extraocular Movements: Extraocular movements intact.     Pupils: Pupils are equal, round, and reactive to light.  Neck:     Thyroid: No thyroid mass, thyromegaly or thyroid tenderness.     Vascular: No carotid bruit.  Cardiovascular:     Rate and Rhythm: Normal rate and regular rhythm. No extrasystoles are present.    Pulses:          Dorsalis pedis pulses are 1+ on the right side and 1+ on the left side.     Heart sounds: Normal heart sounds. No murmur heard.    No friction rub. No gallop.  Pulmonary:     Effort: Pulmonary effort is normal.     Breath sounds: Normal breath sounds. No decreased breath sounds, wheezing, rhonchi or rales.  Chest:     Chest wall: No mass.  Abdominal:     Palpations: Abdomen is soft. There is no hepatomegaly, splenomegaly or mass.     Tenderness: There is no abdominal tenderness.     Hernia: No hernia is present.  Genitourinary:    Comments: NFEG. Bimanual exam normal. Musculoskeletal:     Cervical back: Normal range of motion.     Right lower leg: No edema.     Left lower leg: No edema.  Lymphadenopathy:     Cervical: No cervical adenopathy.     Upper Body:     Right upper body: No supraclavicular adenopathy.     Left upper body: No supraclavicular adenopathy.  Skin:    General: Skin is warm and dry.  Neurological:     General: No focal deficit present.     Mental Status: She is alert and oriented to person, place, and time. Mental status is at baseline.     Sensory: Sensation is intact.     Motor: Motor function is intact. No weakness.     Deep Tendon Reflexes: Reflexes are normal and symmetric.  Psychiatric:        Attention and Perception: Attention normal.        Mood and Affect: Mood normal.        Speech: Speech normal.        Behavior: Behavior normal.        Thought Content: Thought content normal.        Cognition and  Memory: Cognition normal.        Judgment: Judgment normal.       Results:   Studies obtained and personally reviewed by me:  Pap smear last completed 08/09/21. Negative for intraepithelial lesion or malignancy.   Mammogram was normal in August 2023.   Colonoscopy last completed 08/10/23. Showed one 8 mm sessile polyp in proximal descending colon. Repeat recommended in 2029.  Labs:       Component Value Date/Time   NA 140 08/14/2023 0904   K 5.1 08/14/2023 0904   CL 103 08/14/2023 0904  CO2 27 08/14/2023 0904   GLUCOSE 88 08/14/2023 0904   BUN 15 08/14/2023 0904   CREATININE 1.05 08/14/2023 0904   CALCIUM 10.1 08/14/2023 0904   PROT 6.9 08/14/2023 0904   ALBUMIN 4.5 02/10/2017 1142   AST 22 08/14/2023 0904   ALT 14 08/14/2023 0904   ALKPHOS 58 02/10/2017 1142   BILITOT 0.5 08/14/2023 0904   GFRNONAA 61 04/30/2020 1002   GFRAA 71 04/30/2020 1002     Lab Results  Component Value Date   WBC 5.5 08/14/2023   HGB 12.9 08/14/2023   HCT 39.6 08/14/2023   MCV 95.0 08/14/2023   PLT 242 08/14/2023    Lab Results  Component Value Date   CHOL 180 08/14/2023   HDL 84 08/14/2023   LDLCALC 83 08/14/2023   TRIG 54 08/14/2023   CHOLHDL 2.1 08/14/2023    Lab Results  Component Value Date   HGBA1C 5.3 03/14/2022     Lab Results  Component Value Date   TSH 3.66 08/14/2023      Assessment & Plan:   Her general health is excellent.  Musculoskeletal pain hands/forearms: discussed options for treatment and she will consider.  Hyperlipidemia: treated with simvastatin 20 mg daily. Lipid panel normal.  Hypothyroidism: treated with levothyroxine 50 mcg daily. TSH normal at 3.66.  GERD: treated with esomeprazole 20 mg as needed.  Allergic rhinitis: treated with Flonase, Zyrtec, montelukast.    History of migraine headaches.  Urinalysis normal today.  Pap smear last completed 08/09/21. Negative for intraepithelial lesion or malignancy.   Mammogram was normal in  August 2023. Repeat scheduled for 08/26/23.  Colonoscopy last completed 08/10/23. Showed one 8 mm sessile polyp in proximal descending colon. Repeat recommended in 2029.  Vaccine counseling: UTD on tetanus vaccine. Suggested flu booster.  Return in 1 year or as needed.    I,Alexander Ruley,acting as a Neurosurgeon for Margaree Mackintosh, MD.,have documented all relevant documentation on the behalf of Margaree Mackintosh, MD,as directed by  Margaree Mackintosh, MD while in the presence of Margaree Mackintosh, MD.   I, Margaree Mackintosh, MD, have reviewed all documentation for this visit. The documentation on 08/21/23 for the exam, diagnosis, procedures, and orders are all accurate and complete.

## 2023-08-14 ENCOUNTER — Other Ambulatory Visit: Payer: 59

## 2023-08-14 DIAGNOSIS — Z1329 Encounter for screening for other suspected endocrine disorder: Secondary | ICD-10-CM | POA: Diagnosis not present

## 2023-08-14 DIAGNOSIS — E039 Hypothyroidism, unspecified: Secondary | ICD-10-CM | POA: Diagnosis not present

## 2023-08-14 DIAGNOSIS — Z0000 Encounter for general adult medical examination without abnormal findings: Secondary | ICD-10-CM | POA: Diagnosis not present

## 2023-08-14 DIAGNOSIS — Z13220 Encounter for screening for lipoid disorders: Secondary | ICD-10-CM | POA: Diagnosis not present

## 2023-08-15 LAB — CBC WITH DIFFERENTIAL/PLATELET
Absolute Monocytes: 473 cells/uL (ref 200–950)
Basophils Absolute: 39 cells/uL (ref 0–200)
Basophils Relative: 0.7 %
Eosinophils Absolute: 182 cells/uL (ref 15–500)
Eosinophils Relative: 3.3 %
HCT: 39.6 % (ref 35.0–45.0)
Hemoglobin: 12.9 g/dL (ref 11.7–15.5)
Lymphs Abs: 1804 cells/uL (ref 850–3900)
MCH: 30.9 pg (ref 27.0–33.0)
MCHC: 32.6 g/dL (ref 32.0–36.0)
MCV: 95 fL (ref 80.0–100.0)
MPV: 9.9 fL (ref 7.5–12.5)
Monocytes Relative: 8.6 %
Neutro Abs: 3003 cells/uL (ref 1500–7800)
Neutrophils Relative %: 54.6 %
Platelets: 242 10*3/uL (ref 140–400)
RBC: 4.17 10*6/uL (ref 3.80–5.10)
RDW: 12.1 % (ref 11.0–15.0)
Total Lymphocyte: 32.8 %
WBC: 5.5 10*3/uL (ref 3.8–10.8)

## 2023-08-15 LAB — COMPLETE METABOLIC PANEL WITH GFR
AG Ratio: 2.1 (calc) (ref 1.0–2.5)
ALT: 14 U/L (ref 6–29)
AST: 22 U/L (ref 10–35)
Albumin: 4.7 g/dL (ref 3.6–5.1)
Alkaline phosphatase (APISO): 71 U/L (ref 37–153)
BUN: 15 mg/dL (ref 7–25)
CO2: 27 mmol/L (ref 20–32)
Calcium: 10.1 mg/dL (ref 8.6–10.4)
Chloride: 103 mmol/L (ref 98–110)
Creat: 1.05 mg/dL (ref 0.50–1.05)
Globulin: 2.2 g/dL (calc) (ref 1.9–3.7)
Glucose, Bld: 88 mg/dL (ref 65–99)
Potassium: 5.1 mmol/L (ref 3.5–5.3)
Sodium: 140 mmol/L (ref 135–146)
Total Bilirubin: 0.5 mg/dL (ref 0.2–1.2)
Total Protein: 6.9 g/dL (ref 6.1–8.1)
eGFR: 61 mL/min/{1.73_m2} (ref >=60)

## 2023-08-15 LAB — LIPID PANEL
Cholesterol: 180 mg/dL (ref <200)
HDL: 84 mg/dL (ref >=50)
LDL Cholesterol (Calc): 83 mg/dL (calc)
Non-HDL Cholesterol (Calc): 96 mg/dL (calc) (ref <130)
Total CHOL/HDL Ratio: 2.1 (calc) (ref <5.0)
Triglycerides: 54 mg/dL (ref <150)

## 2023-08-15 LAB — TSH: TSH: 3.66 mIU/L (ref 0.40–4.50)

## 2023-08-20 ENCOUNTER — Ambulatory Visit: Payer: 59 | Admitting: Internal Medicine

## 2023-08-20 VITALS — BP 110/80 | HR 70 | Ht 60.25 in | Wt 111.0 lb

## 2023-08-20 DIAGNOSIS — Z0000 Encounter for general adult medical examination without abnormal findings: Secondary | ICD-10-CM

## 2023-08-20 DIAGNOSIS — E7801 Familial hypercholesterolemia: Secondary | ICD-10-CM | POA: Diagnosis not present

## 2023-08-20 DIAGNOSIS — K219 Gastro-esophageal reflux disease without esophagitis: Secondary | ICD-10-CM | POA: Diagnosis not present

## 2023-08-20 DIAGNOSIS — Z8669 Personal history of other diseases of the nervous system and sense organs: Secondary | ICD-10-CM

## 2023-08-20 DIAGNOSIS — E039 Hypothyroidism, unspecified: Secondary | ICD-10-CM | POA: Diagnosis not present

## 2023-08-20 DIAGNOSIS — Z8709 Personal history of other diseases of the respiratory system: Secondary | ICD-10-CM | POA: Diagnosis not present

## 2023-08-20 LAB — POCT URINALYSIS DIP (CLINITEK)
Bilirubin, UA: NEGATIVE
Blood, UA: NEGATIVE
Glucose, UA: NEGATIVE mg/dL
Ketones, POC UA: NEGATIVE mg/dL
Leukocytes, UA: NEGATIVE
Nitrite, UA: NEGATIVE
POC PROTEIN,UA: NEGATIVE
Spec Grav, UA: 1.01 (ref 1.010–1.025)
Urobilinogen, UA: 0.2 E.U./dL
pH, UA: 7 (ref 5.0–8.0)

## 2023-08-21 ENCOUNTER — Encounter: Payer: Self-pay | Admitting: Internal Medicine

## 2023-08-21 NOTE — Patient Instructions (Addendum)
It was a pleasure to see you today.  Labs are stable.  Suggest flu booster through employment.  Tetanus immunization is up-to-date.  Colonoscopy is up-to-date.  Mammogram scheduled for October 2024.  Return in 1 year or as needed.

## 2023-08-26 ENCOUNTER — Ambulatory Visit
Admission: RE | Admit: 2023-08-26 | Discharge: 2023-08-26 | Disposition: A | Discharge status: Home / Self Care | Payer: 59 | Source: Ambulatory Visit | Attending: Internal Medicine | Admitting: Internal Medicine

## 2023-08-26 DIAGNOSIS — Z1231 Encounter for screening mammogram for malignant neoplasm of breast: Secondary | ICD-10-CM | POA: Diagnosis not present

## 2023-09-30 DIAGNOSIS — H25013 Cortical age-related cataract, bilateral: Secondary | ICD-10-CM | POA: Diagnosis not present

## 2023-09-30 DIAGNOSIS — H401131 Primary open-angle glaucoma, bilateral, mild stage: Secondary | ICD-10-CM | POA: Diagnosis not present

## 2023-09-30 DIAGNOSIS — H40033 Anatomical narrow angle, bilateral: Secondary | ICD-10-CM | POA: Diagnosis not present

## 2024-01-05 DIAGNOSIS — H2513 Age-related nuclear cataract, bilateral: Secondary | ICD-10-CM | POA: Diagnosis not present

## 2024-01-05 DIAGNOSIS — H401131 Primary open-angle glaucoma, bilateral, mild stage: Secondary | ICD-10-CM | POA: Diagnosis not present

## 2024-03-14 ENCOUNTER — Other Ambulatory Visit: Payer: Self-pay

## 2024-03-14 ENCOUNTER — Other Ambulatory Visit: Payer: Self-pay | Admitting: Internal Medicine

## 2024-03-14 ENCOUNTER — Other Ambulatory Visit (HOSPITAL_COMMUNITY): Payer: Self-pay

## 2024-03-14 DIAGNOSIS — E039 Hypothyroidism, unspecified: Secondary | ICD-10-CM

## 2024-03-14 MED ORDER — LEVOTHYROXINE SODIUM 50 MCG PO TABS
50.0000 ug | ORAL_TABLET | Freq: Every day | ORAL | 2 refills | Status: AC
  Filled 2024-03-14: qty 90, 90d supply, fill #0
  Filled 2024-06-17: qty 90, 90d supply, fill #1
  Filled 2024-09-20: qty 90, 90d supply, fill #2

## 2024-03-15 ENCOUNTER — Other Ambulatory Visit (HOSPITAL_COMMUNITY): Payer: Self-pay

## 2024-03-15 MED ORDER — LATANOPROST 0.005 % OP SOLN
1.0000 [drp] | Freq: Every day | OPHTHALMIC | 3 refills | Status: AC
  Filled 2024-03-15: qty 7.5, 75d supply, fill #0
  Filled 2024-07-14: qty 7.5, 75d supply, fill #1
  Filled 2024-11-21: qty 7.5, 75d supply, fill #2

## 2024-06-17 ENCOUNTER — Other Ambulatory Visit: Payer: Self-pay | Admitting: Internal Medicine

## 2024-06-17 ENCOUNTER — Other Ambulatory Visit: Payer: Self-pay

## 2024-06-20 ENCOUNTER — Other Ambulatory Visit (HOSPITAL_COMMUNITY): Payer: Self-pay

## 2024-06-20 MED ORDER — SIMVASTATIN 20 MG PO TABS
20.0000 mg | ORAL_TABLET | Freq: Every day | ORAL | 3 refills | Status: AC
  Filled 2024-06-20: qty 90, 90d supply, fill #0
  Filled 2024-09-20: qty 90, 90d supply, fill #1

## 2024-07-15 ENCOUNTER — Other Ambulatory Visit (HOSPITAL_COMMUNITY): Payer: Self-pay

## 2024-08-18 ENCOUNTER — Other Ambulatory Visit: Payer: 59

## 2024-08-19 ENCOUNTER — Other Ambulatory Visit

## 2024-08-19 DIAGNOSIS — E7801 Familial hypercholesterolemia: Secondary | ICD-10-CM

## 2024-08-19 DIAGNOSIS — Z0000 Encounter for general adult medical examination without abnormal findings: Secondary | ICD-10-CM

## 2024-08-19 DIAGNOSIS — E039 Hypothyroidism, unspecified: Secondary | ICD-10-CM

## 2024-08-19 LAB — LIPID PANEL
Cholesterol: 179 mg/dL (ref <200)
HDL: 85 mg/dL (ref >=50)
LDL Cholesterol (Calc): 81 mg/dL
Non-HDL Cholesterol (Calc): 94 mg/dL (ref <130)
Total CHOL/HDL Ratio: 2.1 (calc) (ref <5.0)
Triglycerides: 57 mg/dL (ref <150)

## 2024-08-19 LAB — CBC WITH DIFFERENTIAL/PLATELET
Absolute Lymphocytes: 1604 {cells}/uL (ref 850–3900)
Absolute Monocytes: 458 {cells}/uL (ref 200–950)
Basophils Absolute: 49 {cells}/uL (ref 0–200)
Basophils Relative: 0.8 %
Eosinophils Absolute: 390 {cells}/uL (ref 15–500)
Eosinophils Relative: 6.4 %
HCT: 41.7 % (ref 35.0–45.0)
Hemoglobin: 13.6 g/dL (ref 11.7–15.5)
MCH: 30.6 pg (ref 27.0–33.0)
MCHC: 32.6 g/dL (ref 32.0–36.0)
MCV: 93.7 fL (ref 80.0–100.0)
MPV: 9.8 fL (ref 7.5–12.5)
Monocytes Relative: 7.5 %
Neutro Abs: 3599 {cells}/uL (ref 1500–7800)
Neutrophils Relative %: 59 %
Platelets: 247 Thousand/uL (ref 140–400)
RBC: 4.45 Million/uL (ref 3.80–5.10)
RDW: 12.1 % (ref 11.0–15.0)
Total Lymphocyte: 26.3 %
WBC: 6.1 Thousand/uL (ref 3.8–10.8)

## 2024-08-19 LAB — COMPREHENSIVE METABOLIC PANEL WITH GFR
AG Ratio: 1.7 (calc) (ref 1.0–2.5)
ALT: 14 U/L (ref 6–29)
AST: 23 U/L (ref 10–35)
Albumin: 4.7 g/dL (ref 3.6–5.1)
Alkaline phosphatase (APISO): 64 U/L (ref 37–153)
BUN/Creatinine Ratio: 19 (calc) (ref 6–22)
BUN: 20 mg/dL (ref 7–25)
CO2: 27 mmol/L (ref 20–32)
Calcium: 10.3 mg/dL (ref 8.6–10.4)
Chloride: 104 mmol/L (ref 98–110)
Creat: 1.07 mg/dL — ABNORMAL HIGH (ref 0.50–1.05)
Globulin: 2.7 g/dL (ref 1.9–3.7)
Glucose, Bld: 85 mg/dL (ref 65–99)
Potassium: 5.9 mmol/L — ABNORMAL HIGH (ref 3.5–5.3)
Sodium: 141 mmol/L (ref 135–146)
Total Bilirubin: 0.6 mg/dL (ref 0.2–1.2)
Total Protein: 7.4 g/dL (ref 6.1–8.1)
eGFR: 59 mL/min/1.73m2 — ABNORMAL LOW (ref >=60)

## 2024-08-19 LAB — TSH: TSH: 1.84 m[IU]/L (ref 0.40–4.50)

## 2024-08-20 ENCOUNTER — Ambulatory Visit: Payer: Self-pay | Admitting: Internal Medicine

## 2024-08-23 DIAGNOSIS — H401131 Primary open-angle glaucoma, bilateral, mild stage: Secondary | ICD-10-CM | POA: Diagnosis not present

## 2024-08-25 ENCOUNTER — Other Ambulatory Visit (HOSPITAL_COMMUNITY)
Admission: RE | Admit: 2024-08-25 | Discharge: 2024-08-25 | Disposition: A | Source: Ambulatory Visit | Attending: Internal Medicine | Admitting: Internal Medicine

## 2024-08-25 ENCOUNTER — Ambulatory Visit: Payer: 59 | Admitting: Internal Medicine

## 2024-08-25 ENCOUNTER — Encounter: Payer: Self-pay | Admitting: Internal Medicine

## 2024-08-25 VITALS — BP 110/70 | HR 76 | Ht 60.25 in | Wt 111.0 lb

## 2024-08-25 DIAGNOSIS — Z0000 Encounter for general adult medical examination without abnormal findings: Secondary | ICD-10-CM

## 2024-08-25 DIAGNOSIS — E78019 Familial hypercholesterolemia, unspecified: Secondary | ICD-10-CM | POA: Diagnosis not present

## 2024-08-25 DIAGNOSIS — Z8669 Personal history of other diseases of the nervous system and sense organs: Secondary | ICD-10-CM | POA: Diagnosis not present

## 2024-08-25 DIAGNOSIS — Z124 Encounter for screening for malignant neoplasm of cervix: Secondary | ICD-10-CM

## 2024-08-25 DIAGNOSIS — D126 Benign neoplasm of colon, unspecified: Secondary | ICD-10-CM

## 2024-08-25 DIAGNOSIS — K219 Gastro-esophageal reflux disease without esophagitis: Secondary | ICD-10-CM

## 2024-08-25 DIAGNOSIS — E875 Hyperkalemia: Secondary | ICD-10-CM | POA: Diagnosis not present

## 2024-08-25 DIAGNOSIS — Z8709 Personal history of other diseases of the respiratory system: Secondary | ICD-10-CM | POA: Diagnosis not present

## 2024-08-25 DIAGNOSIS — E039 Hypothyroidism, unspecified: Secondary | ICD-10-CM | POA: Diagnosis not present

## 2024-08-25 LAB — POCT URINALYSIS DIP (CLINITEK)
Bilirubin, UA: NEGATIVE
Glucose, UA: NEGATIVE mg/dL
Ketones, POC UA: NEGATIVE mg/dL
Leukocytes, UA: NEGATIVE
Nitrite, UA: NEGATIVE
POC PROTEIN,UA: NEGATIVE
Spec Grav, UA: 1.01 (ref 1.010–1.025)
Urobilinogen, UA: 0.2 U/dL
pH, UA: 6.5 (ref 5.0–8.0)

## 2024-08-25 NOTE — Progress Notes (Signed)
 Annual Comprehensive Physical Exam    Patient Care Team: Jacqueline Ronal PARAS, MD as PCP - General (Internal Medicine)  Visit Date: 08/25/24   Chief Complaint  Patient presents with   Annual Exam   Subjective:  Patient: Jacqueline Graham, Female DOB: January 13, 1961, 63 y.o. MRN: [id number] Vitals:   08/25/24 1450  BP: 110/70   Jacqueline Graham is a 63 y.o. Female who presents today for her Annual Comprehensive Physical Exam. Patient has Hypothyroidism; Glaucoma; Hyperlipidemia; Fibrocystic breast disease; Benign microscopic hematuria; Palpitations; GERD (gastroesophageal reflux disease); Allergic rhinitis; Family history of heart disease; and History of migraine headaches on their problem list.  She said she is doing well and has no new complaints.  History of Hyperlipidemia treated with Simvastatin  20 mg daily. 08/19/2024 Lipid Panel normal.   History of Hypothyroidism treated with Levothyroxine  50 mcg daily. 08/19/2024 TSH is normal at  1.84.   History of GE reflux treated with Esomeprazole  20 mg as needed.   Labs 08/19/2024  Creatinine 1.07, eGFR 59, Potassium 5.9, Otherwise WNL.   Her general health is excellent. History of allergic rhinitis treated with Flonase, Zyrtec, montelukast .  Has glaucoma in both eyes.  History of migraine headaches.  Headaches are less frequent since menopause and are stable.  Past medical history: Left breast fibroadenoma removed in the 1980s. History of [facility] Spotted Fever in 1971.    08/26/2023 Mammogram No mammographic evidence of malignancy. Repeat in one year.     08/10/2023 Colonoscopy one 8 mm sessile polyp in the proximal descending colon, resected and retrieved, Examination was otherwise normal on direct and retroflexion views.  Polyp was a tubular adenoma. 5 year follow up recommended.  Vaccine counseling: Influenza vaccine to be received at work.  Health Maintenance  Topic Date Due   Pneumococcal Vaccine: 50+ Years (1 of 1 - PCV) Never  done   Zoster Vaccines- Shingrix (1 of 2) Never done   Influenza Vaccine  06/24/2024   Cervical Cancer Screening (HPV/Pap Cotest)  08/09/2024   COVID-19 Vaccine (4 - 2025-26 season) 09/10/2024 (Originally 07/25/2024)   Mammogram  08/25/2025   DTaP/Tdap/Td (3 - Td or Tdap) 08/31/2029   Colonoscopy  08/09/2033   Hepatitis B Vaccines 19-59 Average Risk  Aged Out   HPV VACCINES  Aged Out   Meningococcal B Vaccine  Aged Out   Hepatitis C Screening  Discontinued   HIV Screening  Discontinued      Review of Systems  Constitutional:  Negative for fever and malaise/fatigue.  HENT:  Negative for congestion.   Eyes:  Negative for blurred vision.  Respiratory:  Negative for cough and shortness of breath.   Cardiovascular:  Negative for chest pain, palpitations and leg swelling.  Gastrointestinal:  Negative for vomiting.  Musculoskeletal:  Negative for back pain.  Skin:  Negative for rash.  Neurological:  Negative for loss of consciousness and headaches.   Objective:  Vitals: body mass index is 21.5 kg/m. Today's Vitals   08/25/24 1450  BP: 110/70  Pulse: 76  SpO2: 97%  Weight: 111 lb (50.3 kg)  Height: 5' 0.25 (1.53 m)   Physical Exam Vitals and nursing note reviewed. Exam conducted with a chaperone present (Jacqueline Graham, CMA).  Constitutional:      General: She is not in acute distress.    Appearance: Normal appearance. She is not ill-appearing or toxic-appearing.  HENT:     Head: Normocephalic and atraumatic.     Right Ear: Hearing, tympanic membrane, ear canal and  external ear normal.     Left Ear: Hearing, tympanic membrane, ear canal and external ear normal.     Mouth/Throat:     Pharynx: Oropharynx is clear.  Eyes:     Extraocular Movements: Extraocular movements intact.     Pupils: Pupils are equal, round, and reactive to light.  Neck:     Thyroid : No thyroid  mass, thyromegaly or thyroid  tenderness.     Vascular: No carotid bruit.  Cardiovascular:     Rate and  Rhythm: Normal rate and regular rhythm. No extrasystoles are present.    Pulses:          Dorsalis pedis pulses are 2+ on the right side and 2+ on the left side.     Heart sounds: Normal heart sounds. No murmur heard.    No friction rub. No gallop.  Pulmonary:     Effort: Pulmonary effort is normal.     Breath sounds: Normal breath sounds. No decreased breath sounds, wheezing, rhonchi or rales.  Chest:     Chest wall: No mass.  Abdominal:     Palpations: Abdomen is soft. There is no hepatomegaly, splenomegaly or mass.     Tenderness: There is no abdominal tenderness.     Hernia: No hernia is present.  Genitourinary:    Comments: PAP smear completed Musculoskeletal:     Cervical back: Normal range of motion.     Right lower leg: No edema.     Left lower leg: No edema.  Lymphadenopathy:     Cervical: No cervical adenopathy.     Upper Body:     Right upper body: No supraclavicular adenopathy.     Left upper body: No supraclavicular adenopathy.  Skin:    General: Skin is warm and dry.  Neurological:     General: No focal deficit present.     Mental Status: She is alert and oriented to person, place, and time. Mental status is at baseline.     Sensory: Sensation is intact.     Motor: Motor function is intact. No weakness.     Deep Tendon Reflexes: Reflexes are normal and symmetric.  Psychiatric:        Attention and Perception: Attention normal.        Mood and Affect: Mood normal.        Speech: Speech normal.        Behavior: Behavior normal.        Thought Content: Thought content normal.        Cognition and Memory: Cognition normal.        Judgment: Judgment normal.     Current Outpatient Medications  Medication Instructions   famotidine (PEPCID) 20 mg, Oral, Daily   fluticasone (FLONASE) 50 MCG/ACT nasal spray 1 spray, Each Nare, Daily   latanoprost  (XALATAN ) 0.005 % ophthalmic solution 1 drop, Both Eyes, Daily at bedtime   latanoprost  (XALATAN ) 0.005 % ophthalmic  solution 1 drop, Both Eyes, Daily at bedtime   levothyroxine  (SYNTHROID ) 50 mcg, Oral, Daily   Probiotic Product (PROBIOTIC BLEND PO) Oral   simvastatin  (ZOCOR ) 20 mg, Oral, Daily   Past Medical History:  Diagnosis Date   Hyperlipidemia    Medical/Surgical History Narrative:  Allergic/Intolerant to:  Allergies  Allergen Reactions   Amoxicillin    Sulfa Antibiotics     Past Surgical History:  Procedure Laterality Date   BREAST SURGERY     fibroadenoma   GUM SURGERY     Family History  Problem Relation Age of  Onset   Diabetes Mother    Hypertension Mother    Heart disease Mother    Hyperlipidemia Mother    Heart disease Father    Stroke Father    Family History Narrative: Father with history of stroke. He had a pacemaker implanted with history of third-degree AV block with syncope. Mother with history of dementia, kidney stones, acute renal failure after receiving IV contrast dye, hyperlipidemia, hypertension and hypothyroidism. Mother has history of coronary artery disease status post stent placement. Paternal grandmother with history of colon cancer. Maternal grandmother with history of lymphoma. 3 brothers and 1 sister. 1 brother has diabetes, hypertension and kidney stones.   Social History Narrative: Non-smoker. Social alcohol consumption. 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 No children. 2-year college degree. She stays in excellent physical condition being active at work.   Most Recent Health Risks Assessment:   Most Recent Social Determinants of Health (Including Hx of Tobacco, Alcohol, and Drug Use) SDOH Screenings   Food Insecurity: No Food Insecurity (08/19/2023)  Housing: Low Risk  (08/19/2023)  Transportation Needs: No Transportation Needs (08/19/2023)  Utilities: Not At Risk (08/19/2023)  Alcohol Screen: Low Risk  (08/19/2023)  Depression (PHQ2-9): Low Risk  (08/25/2024)  Financial Resource Strain: Low Risk  (08/19/2023)  Physical Activity:  Sufficiently Active (08/19/2023)  Social Connections: Moderately Isolated (08/19/2023)  Stress: No Stress Concern Present (08/19/2023)  Tobacco Use: Low Risk  (08/25/2024)  Health Literacy: Adequate Health Literacy (08/19/2023)   Social History   Tobacco Use   Smoking status: Never   Smokeless tobacco: Never  Substance Use Topics   Alcohol use: Yes    Alcohol/week: 0.0 standard drinks of alcohol    Comment: social   Drug use: No   Most Recent Functional Status Assessment:  Most Recent Fall Risk Assessment:    08/20/2023    3:03 PM  Fall Risk   Falls in the past year? 0  Number falls in past yr: 0  Injury with Fall? 0  Risk for fall due to : No Fall Risks  Follow up Falls evaluation completed   Most Recent Anxiety/Depression Screenings:    08/25/2024    2:51 PM 08/20/2023    3:03 PM  PHQ 2/9 Scores  PHQ - 2 Score 0 0     Results:  Studies Obtained And Personally Reviewed By Me:   08/26/2023 Mammogram No mammographic evidence of malignancy. Repeat in one year.     08/10/2023 Colonoscopy one 8 mm sessile polyp in the proximal descending colon, resected and retrieved, Examination was otherwise normal on direct and retroflexion views.   Labs:  CBC w/ Differential Lab Results  Component Value Date   WBC 6.1 08/19/2024   RBC 4.45 08/19/2024   HGB 13.6 08/19/2024   HCT 41.7 08/19/2024   PLT 247 08/19/2024   MCV 93.7 08/19/2024   MCH 30.6 08/19/2024   MCHC 32.6 08/19/2024   RDW 12.1 08/19/2024   MPV 9.8 08/19/2024   LYMPHSABS 1,804 08/14/2023   MONOABS 486 02/10/2017   BASOSABS 49 08/19/2024    Comprehensive Metabolic Panel Lab Results  Component Value Date   NA 141 08/19/2024   K 5.9 (H) 08/19/2024   CL 104 08/19/2024   CO2 27 08/19/2024   GLUCOSE 85 08/19/2024   BUN 20 08/19/2024   CREATININE 1.07 (H) 08/19/2024   CALCIUM 10.3 08/19/2024   PROT 7.4 08/19/2024   ALBUMIN 4.5 02/10/2017   AST 23 08/19/2024   ALT 14 08/19/2024  ALKPHOS 58 02/10/2017    BILITOT 0.6 08/19/2024   EGFR 59 (L) 08/19/2024   GFRNONAA 61 04/30/2020   Lipid Panel  Lab Results  Component Value Date   CHOL 179 08/19/2024   HDL 85 08/19/2024   LDLCALC 81 08/19/2024   TRIG 57 08/19/2024   A1c Lab Results  Component Value Date   HGBA1C 5.3 03/14/2022    TSH Lab Results  Component Value Date   TSH 1.84 08/19/2024    Assessment & Plan:   Orders Placed This Encounter  Procedures   Potassium   POCT URINALYSIS DIP (CLINITEK)      Hyperlipidemia: treated with Simvastatin  20 mg daily. 08/19/2024 Lipid Panel normal.   Hypothyroidism: treated with Levothyroxine  50 mcg daily. 08/19/2024 TSH is normal at  1.84.   GE reflux: treated with Esomeprazole  20 mg as needed.   Labs 08/19/2024  Creatinine 1.07, eGFR 59, Potassium 5.9, Otherwise WNL.  Repeat Potassium ordered.    Glaucoma: treated with Xalatan  0.005% ophthalmic solution.   History of migraine headaches.    08/26/2023 Mammogram No mammographic evidence of malignancy. Repeat in one year.     08/10/2023 Colonoscopy one 8 mm sessile polyp in the proximal descending colon, resected and retrieved, Examination was otherwise normal on direct and retroflexion views.    Vaccine counseling:   Influenza vaccine to be received at work. Covid, Shingrix, Pneumococcal vaccines declined. Tdap is up to date.  Serum potassium elevated is likely a lab error and will be repeated today. Addendum: This is normal at 4.8.    Annual Comprehensive Physical Exam done today including the all of the following: Reviewed patient's Family Medical History Reviewed patient's SDOH and reviewed tobacco, alcohol, and drug use.  Reviewed and updated list of patient's medical providers Assessment of cognitive impairment was done Assessed patient's functional ability Established a written schedule for health screening services Health Risk Assessent Completed and Reviewed  Discussed health benefits of physical activity, and  encouraged her to engage in regular exercise appropriate for her age and condition.    I,Makayla C Reid,acting as a scribe for Ronal JINNY Hailstone, MD.,have documented all relevant documentation on the behalf of Ronal JINNY Hailstone, MD,as directed by  Ronal JINNY Hailstone, MD while in the presence of Ronal JINNY Hailstone, MD.   I, Ronal JINNY Hailstone, MD, have reviewed all documentation for and agree with the above Annual Wellness Visit documentation.  Ronal JINNY Hailstone, MD Internal Medicine 08/25/2024

## 2024-08-26 ENCOUNTER — Encounter: Payer: Self-pay | Admitting: Internal Medicine

## 2024-08-26 ENCOUNTER — Ambulatory Visit: Payer: Self-pay | Admitting: Internal Medicine

## 2024-08-26 LAB — POTASSIUM: Potassium: 4.8 mmol/L (ref 3.5–5.3)

## 2024-08-26 NOTE — Patient Instructions (Signed)
 Influenza vaccine to be given at work. Pap obtained today. Serum potassium elevated on lab draw was repeated and is normal so this was a lab error. It was a pleasure to see you today as always. Return in one year or as needed.

## 2024-08-29 ENCOUNTER — Other Ambulatory Visit: Payer: Self-pay | Admitting: Internal Medicine

## 2024-08-29 DIAGNOSIS — Z1231 Encounter for screening mammogram for malignant neoplasm of breast: Secondary | ICD-10-CM

## 2024-08-30 LAB — CYTOLOGY - PAP: Diagnosis: NEGATIVE

## 2024-09-07 ENCOUNTER — Ambulatory Visit
Admission: RE | Admit: 2024-09-07 | Discharge: 2024-09-07 | Disposition: A | Discharge status: Home / Self Care | Source: Ambulatory Visit | Attending: Internal Medicine | Admitting: Internal Medicine

## 2024-09-07 DIAGNOSIS — Z1231 Encounter for screening mammogram for malignant neoplasm of breast: Secondary | ICD-10-CM

## 2024-09-29 ENCOUNTER — Encounter: Payer: 59 | Admitting: Internal Medicine
